# Patient Record
Sex: Female | Born: 1965 | Race: White | Hispanic: No | Smoking: Former smoker
Health system: Southern US, Community
[De-identification: ages and names within clinical notes are randomized; demographics above are authoritative.]

## PROBLEM LIST (undated history)

## (undated) DIAGNOSIS — E039 Hypothyroidism, unspecified: Secondary | ICD-10-CM

## (undated) DIAGNOSIS — F32A Depression, unspecified: Secondary | ICD-10-CM

## (undated) DIAGNOSIS — F329 Major depressive disorder, single episode, unspecified: Secondary | ICD-10-CM

## (undated) DIAGNOSIS — D649 Anemia, unspecified: Secondary | ICD-10-CM

## (undated) DIAGNOSIS — K5792 Diverticulitis of intestine, part unspecified, without perforation or abscess without bleeding: Secondary | ICD-10-CM

## (undated) HISTORY — PX: APPENDECTOMY: SHX54

## (undated) HISTORY — PX: CHOLECYSTECTOMY: SHX55

## (undated) HISTORY — PX: TONSILLECTOMY: SUR1361

## (undated) HISTORY — DX: Major depressive disorder, single episode, unspecified: F32.9

## (undated) HISTORY — PX: TUBAL LIGATION: SHX77

## (undated) HISTORY — DX: Depression, unspecified: F32.A

---

## 1998-04-05 ENCOUNTER — Emergency Department (HOSPITAL_COMMUNITY): Admission: EM | Admit: 1998-04-05 | Discharge: 1998-04-05 | Payer: Self-pay | Admitting: Emergency Medicine

## 2000-03-27 ENCOUNTER — Other Ambulatory Visit: Admission: RE | Admit: 2000-03-27 | Discharge: 2000-03-27 | Payer: Self-pay | Admitting: Gynecology

## 2000-08-07 ENCOUNTER — Encounter: Payer: Self-pay | Admitting: Emergency Medicine

## 2000-08-07 ENCOUNTER — Emergency Department (HOSPITAL_COMMUNITY): Admission: EM | Admit: 2000-08-07 | Discharge: 2000-08-07 | Payer: Self-pay | Admitting: Emergency Medicine

## 2001-04-01 ENCOUNTER — Other Ambulatory Visit: Admission: RE | Admit: 2001-04-01 | Discharge: 2001-04-01 | Payer: Self-pay | Admitting: Gynecology

## 2001-10-08 ENCOUNTER — Other Ambulatory Visit: Admission: RE | Admit: 2001-10-08 | Discharge: 2001-10-08 | Payer: Self-pay | Admitting: Gynecology

## 2002-05-05 ENCOUNTER — Other Ambulatory Visit: Admission: RE | Admit: 2002-05-05 | Discharge: 2002-05-05 | Payer: Self-pay | Admitting: Gynecology

## 2008-08-27 HISTORY — PX: OTHER SURGICAL HISTORY: SHX169

## 2010-06-22 ENCOUNTER — Encounter: Payer: Self-pay | Admitting: Gastroenterology

## 2010-06-27 ENCOUNTER — Encounter (INDEPENDENT_AMBULATORY_CARE_PROVIDER_SITE_OTHER): Payer: Self-pay | Admitting: *Deleted

## 2010-07-13 ENCOUNTER — Ambulatory Visit: Payer: Self-pay | Admitting: Gastroenterology

## 2010-07-13 DIAGNOSIS — N83209 Unspecified ovarian cyst, unspecified side: Secondary | ICD-10-CM

## 2010-07-13 DIAGNOSIS — R634 Abnormal weight loss: Secondary | ICD-10-CM | POA: Insufficient documentation

## 2010-07-13 DIAGNOSIS — R933 Abnormal findings on diagnostic imaging of other parts of digestive tract: Secondary | ICD-10-CM

## 2010-07-17 LAB — CONVERTED CEMR LAB
CRP, High Sensitivity: 5.46 — ABNORMAL HIGH (ref 0.00–5.00)
IgA: 324 mg/dL (ref 68–378)
Sed Rate: 10 mm/hr (ref 0–22)
Tissue Transglutaminase Ab, IgA: 5.4 units (ref <20)

## 2010-08-09 ENCOUNTER — Telehealth: Payer: Self-pay | Admitting: Gastroenterology

## 2010-08-16 ENCOUNTER — Ambulatory Visit: Payer: Self-pay | Admitting: Gastroenterology

## 2010-08-16 LAB — CONVERTED CEMR LAB
Fecal Occult Blood: NEGATIVE
OCCULT 1: NEGATIVE
OCCULT 2: NEGATIVE
OCCULT 3: NEGATIVE
OCCULT 4: NEGATIVE
OCCULT 5: NEGATIVE

## 2010-08-18 ENCOUNTER — Ambulatory Visit: Payer: Self-pay | Admitting: Gastroenterology

## 2010-08-23 ENCOUNTER — Encounter: Payer: Self-pay | Admitting: Gastroenterology

## 2010-08-29 ENCOUNTER — Telehealth (INDEPENDENT_AMBULATORY_CARE_PROVIDER_SITE_OTHER): Payer: Self-pay

## 2010-09-26 NOTE — Assessment & Plan Note (Signed)
Summary: ABD PAIN...AS.   History of Present Illness Visit Type: new patient  Primary GI MD: Elie Goody MD [facility] Primary Provider: Eunice Blase, PA  Requesting Provider: na Chief Complaint: Belching, loss of appetite, nausea and vomiting, and weight loss History of Present Illness:   This is a 45 year old female complaining of a 100 pound weight loss, loss of appetite, early satiety, nausea, vomiting, belching, and intermittent mild epigastric pain that began around March. She states she started walking on a regular basis in the spring but discontinued this about July. She estimates she is only able to take in about 500 calories per day due to her loss of appetite, early satiety, and significant postprandial symptoms if she eats more than just a few bites of food. She states her TSH was performed several months ago and was normal. CBC and chemistry panel were remarkable only for modestly elevated white blood cell count of 11.7. Amylase and lipase were normal. CT scan of the abdomen performed on October 27 at [facility], showed a question of jejunal thickening and a 4.7 cm right ovarian cyst.   GI Review of Systems    Reports belching.   Weight loss of 100 pounds over one year .   Denies abdominal pain, acid reflux, bloating, chest pain, dysphagia with liquids, dysphagia with solids, heartburn, loss of appetite, nausea, vomiting, vomiting blood, weight loss, and  weight gain.        Denies anal fissure, black tarry stools, change in bowel habit, constipation, diarrhea, diverticulosis, fecal incontinence, heme positive stool, hemorrhoids, irritable bowel syndrome, jaundice, light color stool, liver problems, rectal bleeding, and  rectal pain.   Current Medications (verified): 1)  Levothyroxine Sodium 88 Mcg Tabs (Levothyroxine Sodium) .... One Tablet By Mouth Once Daily 2)  Prilosec Otc 20 Mg Tbec (Omeprazole Magnesium) .... One By Mouth Once Daily 3)  Xanax 0.5 Mg Tabs  (Alprazolam) .... One Tablet By Mouth As Needed  Allergies (verified): 1)  ! Cipro 2)  ! Demerol  Past History:  Past Medical History: Hypothyroidism Hyperlipidemia Hypertension Allergic Rhinitis Anxiety Disorder Insomnia Arthritis  Past Surgical History: Reviewed history from 07/13/2010 and no changes required. Appendectomy 1984 Cholecystectomy 1990 Tonsillectomy  Family History: Family History of Diabetes: Father Family History of Heart Disease: Mother No FH of Colon Cancer:  Social History: Chief Strategy Officer Divorced 3 Childern Patient currently smokes Alcohol Use - no Illicit Drug Use - no Daily Caffeine Use: 3 daily   Review of Systems       The patient complains of anxiety-new, fatigue, muscle pains/cramps, and sleeping problems.         The pertinent positives and negatives are noted as above and in the HPI. All other ROS were reviewed and were negative.   Vital Signs:  Patient profile:   45 year old female Height:      66 inches Weight:      177 pounds BMI:     28.67 BSA:     1.90 Pulse rate:   76 / minute Pulse rhythm:   regular BP sitting:   128 / 64  (left arm) Cuff size:   regular  Vitals Entered By: Ok Anis CMA (July 13, 2010 2:42 PM)  Physical Exam  General:  Well developed, well nourished, no acute distress. Head:  Normocephalic and atraumatic. Eyes:  PERRLA, no icterus. Ears:  Normal auditory acuity. Mouth:  No deformity or lesions, dentition normal. Neck:  Supple; no masses or thyromegaly. Lungs:  Clear throughout to auscultation. Heart:  Regular rate and rhythm; no murmurs, rubs,  or bruits. Abdomen:  Soft, nontender and nondistended. No masses, hepatosplenomegaly or hernias noted. Normal bowel sounds. Msk:  Symmetrical with no gross deformities. Normal posture. Pulses:  Normal pulses noted. Extremities:  No clubbing, cyanosis, edema or deformities noted. Neurologic:  Alert and  oriented x4;  grossly normal  neurologically. Cervical Nodes:  No significant cervical adenopathy. Psych:  Alert and cooperative. Normal mood and affect.  Impression & Recommendations:  Problem # 1:  LOSS OF WEIGHT (ICD-783.21) Self reported 100 pound weight loss with early satiety, anorexia, minimal epigastric discomfort, intermittent nausea and vomiting over the past 8 months. Etiology is not clear. Rule out ulcer disease, gastroparesis, median arcuate ligament syndrome, atypical reflux, small intestinal neoplasm, malabsorption syndrome. Resume Prilosec 20 mg q.a.m. The risks, benefits and alternatives to endoscopy with possible biopsy and possible dilation were discussed with the patient and they consent to proceed. The procedure will be scheduled electively. Consider CTA or MRA as next step. Orders: TLB-CRP-High Sensitivity (C-Reactive Protein) (86140-FCRP) TLB-IgA (Immunoglobulin A) (82784-IGA) TLB-Sedimentation Rate (ESR) (85652-ESR) T-Sprue Panel (Celiac Disease Aby Eval) (83516x3/[address]) T-Stool Fats [place] Stain [phone]) [facility] Hemoccult Cards #3 (take home) (Hem cards #3) EGD (EGD)  Problem # 2:  NONSPECIFIC ABN FINDING RAD & OTH EXAM GI TRACT (ICD-793.4) Nonspecific questionable jejunal thickening on CT scan. If above evaluation is unremarkable consider further evaluation with a CT enterography or capsule endoscopy. Orders: EGD (EGD)  Problem # 3:  OVARIAN CYST, RIGHT (ICD-620.2) Followup with her primary physician and gynecologist for the right ovarian cyst.   Patient Instructions: 1)  Please go directly to the basement to have your labs drawn. 2)  Follow instructions on Hemoccult cards and mail back to [place] when finished.  3)  Start Prilosec OTC 20mg  one capsule by mouth once daily. 4)  Upper Endoscopy brochure given.  5)  Copy sent to : Greta O'Buch, PA 6)  The medication list was reviewed and reconciled.  All changed / newly prescribed medications were explained.  A complete medication list  was provided to the patient / caregiver.

## 2010-09-26 NOTE — Letter (Signed)
Summary: EGD Instructions  [facility]  [address] [place], [place] [address]   Phone: [phone]  Fax: [phone]       Carla Herrera    February 27, 1966    MRN: [id number]       Procedure Day /Date: Friday December 23rd, 2011     Arrival Time:  1:30pm     Procedure Time: 2:30pm     Location of Procedure:                    _x  _  Endoscopy Center (4th Floor)    PREPARATION FOR ENDOSCOPY   On 08/18/10 THE DAY OF THE PROCEDURE:  1.   No solid foods, milk or milk products are allowed after midnight the night before your procedure.  2.   Do not drink anything colored red or purple.  Avoid juices with pulp.  No orange juice.  3.  You may drink clear liquids until12:30pm, which is 2 hours before your procedure.                                                                                                CLEAR LIQUIDS INCLUDE: Water Jello Ice Popsicles Tea (sugar ok, no milk/cream) Powdered fruit flavored drinks Coffee (sugar ok, no milk/cream) Gatorade Juice: apple, white grape, white cranberry  Lemonade Clear bullion, consomm, broth Carbonated beverages (any kind) Strained chicken noodle soup Hard Candy   MEDICATION INSTRUCTIONS  Unless otherwise instructed, you should take regular prescription medications with a small sip of water as early as possible the morning of your procedure.        OTHER INSTRUCTIONS  You will need a responsible adult at least 44 years of age to accompany you and drive you home.   This person must remain in the waiting room during your procedure.  Wear loose fitting clothing that is easily removed.  Leave jewelry and other valuables at home.  However, you may wish to bring a book to read or an iPod/MP3 player to listen to music as you wait for your procedure to start.  Remove all body piercing jewelry and leave at home.  Total time from sign-in until discharge is approximately 2-3 hours.  You should go home  directly after your procedure and rest.  You can resume normal activities the day after your procedure.  The day of your procedure you should not:   Drive   Make legal decisions   Operate machinery   Drink alcohol   Return to work  You will receive specific instructions about eating, activities and medications before you leave.    The above instructions have been reviewed and explained to me by   _______________________    I fully understand and can verbalize these instructions _____________________________ Date _________

## 2010-09-26 NOTE — Letter (Signed)
Summary: New Patient letter  [facility]  [address] [place], [place] [address]   Phone: [phone]  Fax: [phone]       06/27/2010 MRN: [id number]  Carla Herrera [address] [place], [place]  [address]  Dear Ms. Spitler,  Welcome to the Gastroenterology Division at [facility].    You are scheduled to see Dr. Russella Dar on 08/08/2010 at 10:00AM on the 3rd floor at [facility], [address]  We ask that you try to arrive at our office 15 minutes prior to your appointment time to allow for check-in.  We would like you to complete the enclosed self-administered evaluation form prior to your visit and bring it with you on the day of your appointment.  We will review it with you.  Also, please bring a complete list of all your medications or, if you prefer, bring the medication bottles and we will list them.  Please bring your insurance card so that we may make a copy of it.  If your insurance requires a referral to see a specialist, please bring your referral form from your primary care physician.  Co-payments are due at the time of your visit and may be paid by cash, check or credit card.     Your office visit will consist of a consult with your physician (includes a physical exam), any laboratory testing he/she may order, scheduling of any necessary diagnostic testing (e.g. x-ray, ultrasound, CT-scan), and scheduling of a procedure (e.g. Endoscopy, Colonoscopy) if required.  Please allow enough time on your schedule to allow for any/all of these possibilities.    If you cannot keep your appointment, please call [phone] to cancel or reschedule prior to your appointment date.  This allows [place] the opportunity to schedule an appointment for another patient in need of care.  If you do not cancel or reschedule by 5 p.m. the business day prior to your appointment date, you will be charged a $50.00 late cancellation/no-show fee.    Thank you for choosing  [facility] for your medical needs.  We appreciate the opportunity to care for you.  Please visit [place] at our website  to learn more about our practice.                     Sincerely,                                                             The Gastroenterology Division

## 2010-09-26 NOTE — Letter (Signed)
Summary: [facility]  [facility]   Imported By: Sherian Rein 07/18/2010 10:35:11  _____________________________________________________________________  External Attachment:    Type:   Image     Comment:   External Document

## 2010-09-28 NOTE — Procedures (Signed)
Summary: Upper Endoscopy  Patient: Carla Herrera Note: All result statuses are Final unless otherwise noted.  Tests: (1) Upper Endoscopy (EGD)   EGD Upper Endoscopy       DONE     [facility]     [address]     [place], [place]  [address]           ENDOSCOPY PROCEDURE REPORT     PATIENT:  Carla, Herrera  MR#:  [id number]     BIRTHDATE:  11-05-1965, 44 yrs. old  GENDER:  female     ENDOSCOPIST:  Judie Petit T. Russella Dar, MD, [facility]           PROCEDURE DATE:  08/18/2010     PROCEDURE:  EGD with biopsy, [address]     ASA CLASS:  Class I     INDICATIONS:  weight loss, epigastric pain, early satiety     MEDICATIONS:  Fentanyl 100 mcg IV, Versed 10 mg IV     TOPICAL ANESTHETIC:  Exactacain Spray     DESCRIPTION OF PROCEDURE:   After the risks benefits and     alternatives of the procedure were thoroughly explained, informed     consent was obtained.  The LB [id number]-[id number] endoscope was     introduced through the mouth and advanced to the second portion of     the duodenum, without limitations.  The instrument was slowly     withdrawn as the mucosa was fully examined.     <<PROCEDUREIMAGES>>     Duodenitis was found in the bulb of the duodenum. It was mild and     erythematous. Multiple biopsies were obtained and sent to     pathology. The duodenal bulb was normal in appearance, as was the     postbulbar duodenum. in the descending duodenum. Random biopsies     were obtained and sent to pathology.  The stomach was entered and     closely examined. The pylorus, antrum, angularis, and lesser     curvature were well visualized, including a retroflexed view of     the cardia and fundus. The stomach wall was normally distensable.     The scope passed easily through the pylorus into the duodenum.     There was mild granular erythema throught the stomach, normal vs     mild gastritis. Biopsies were obtained and sent to pathology.  The     esophagus and gastroesophageal junction were  completely normal in     appearance.  Retroflexed views revealed no abnormalities.  The     scope was then withdrawn from the patient and the procedure     completed.           COMPLICATIONS:  None           ENDOSCOPIC IMPRESSION:     1) Duodenitis in the bulb of duodenum     2) R/O gastritis           RECOMMENDATIONS:     1) Anti-reflux regimen     2) Await pathology results     3) PPI bid; omeprazole 20mg  po bid, #60, 5 refills     4) My office will arrange for you to have a Gastric Emptying     Scan performed.           Venita Lick. Russella Dar, MD, [facility]           n.     eSIGNED:   Venita Lick. Russella Dar at  08/18/2010 02:49 PM           Annia Belt, [id number]  Note: An exclamation mark (!) indicates a result that was not dispersed into the flowsheet. Document Creation Date: 08/18/2010 2:49 PM _______________________________________________________________________  (1) Order result status: Final Collection or observation date-time: 08/18/2010 14:36 Requested date-time:  Receipt date-time:  Reported date-time:  Referring Physician:   Ordering Physician: Claudette Head [phone]) Specimen Source:  Source: Launa Grill Order Number: [id number] Lab site:

## 2010-09-28 NOTE — Progress Notes (Signed)
Summary: Hemoccult cards  Phone Note Outgoing Call Call back at Home Phone [phone]   Call placed by: Christie Nottingham CMA Duncan Dull),  August 09, 2010 2:35 PM Call placed to: Patient Summary of Call: Called pt to ask if she has had a chance to mail back the Hemoccult cards. Left a message on her voicemail. Initial call taken by: Christie Nottingham CMA Duncan Dull),  August 09, 2010 2:36 PM  Follow-up for Phone Call        Pt states her Hemoccult cards have slipped her mind and that she will do them this week and bring them back into our office soon.  Follow-up by: Christie Nottingham CMA Duncan Dull),  August 10, 2010 11:55 AM

## 2010-09-28 NOTE — Letter (Signed)
Summary: Patient [facility] Biopsy Results  [facility]  [address] [place], [place] [address]   Phone: [phone]  Fax: [phone]        August 23, 2010 MRN: [id number]    Carla Herrera [address] [place], [place]  [address]    Dear Ms. Filsaime,  I am pleased to inform you that the biopsies taken during your recent endoscopic examination did not show any evidence of cancer upon pathologic examination. The gastric and duodenal biopsies were normal.   Continue with the treatment plan as outlined on the day of your      exam.  Please call us if you are having persistent problems or have questions about your condition that have not been fully answered at this time.  Sincerely,  Meryl Dare MD [facility]  This letter has been electronically signed by your physician.  Appended Document: Patient Notice-Endo Biopsy Results Letter mailed

## 2010-09-28 NOTE — Miscellaneous (Signed)
Summary: rx, omeprazole  Clinical Lists Changes  Medications: Added new medication of OMEPRAZOLE 20 MG  CPDR (OMEPRAZOLE) 1 twice a day 30 minutes before meals - Signed Rx of OMEPRAZOLE 20 MG  CPDR (OMEPRAZOLE) 1 twice a day 30 minutes before meals;  #60 x 5;  Signed;  Entered by: Weston Brass RN;  Authorized by: Meryl Dare MD [facility];  Method used: Electronically to [facility] [address] [place], [place]  [address], Ph: [phone], Fax: [phone] Observations: Added new observation of ALLERGY REV: Done (08/18/2010 14:51)    Prescriptions: OMEPRAZOLE 20 MG  CPDR (OMEPRAZOLE) 1 twice a day 30 minutes before meals  #60 x 5   Entered by:   Weston Brass RN   Authorized by:   Meryl Dare MD [facility]   Signed by:   Weston Brass RN on 08/18/2010   Method used:   Electronically to        [facility] [phone]* (retail)       [address]       [place], [place]  [address]       Ph: [phone]       Fax: [phone]   RxID:   [id number]

## 2010-09-28 NOTE — Progress Notes (Signed)
Summary: Schedule GES  Phone Note Outgoing Call Call back at [facility] Phone [phone]   Call placed by: Darcey Nora RN, CGRN,  August 29, 2010 10:23 AM Call placed to: Patient Summary of Call: Left message for patient to call back to discuss scheduling GES Initial call taken by: Darcey Nora RN, CGRN,  August 29, 2010 10:23 AM  Follow-up for Phone Call        Left message for patient to call back Darcey Nora RN, [facility]  August 30, 2010 9:35 AM  Patient declines to schedule GES at this time due to finances.  She will call back if she wants to have GES. Follow-up by: Darcey Nora RN, CGRN,  September 04, 2010 11:26 AM  Additional Follow-up for Phone Call Additional follow up Details #1::        Above noted. Additional Follow-up by: Meryl Dare MD [facility],  September 04, 2010 11:31 AM

## 2011-06-28 ENCOUNTER — Other Ambulatory Visit: Payer: Self-pay | Admitting: Gastroenterology

## 2011-06-28 NOTE — Telephone Encounter (Signed)
NEEDS OFFICE VISIT FOR ANY FURTHER REFILLS! 

## 2015-02-11 ENCOUNTER — Inpatient Hospital Stay (HOSPITAL_COMMUNITY)
Admission: AD | Admit: 2015-02-11 | Discharge: 2015-02-22 | DRG: 330 | Disposition: A | Discharge status: Home Health | Payer: Self-pay | Source: Ambulatory Visit | Attending: General Surgery | Admitting: General Surgery

## 2015-02-11 ENCOUNTER — Encounter (HOSPITAL_COMMUNITY): Payer: Self-pay

## 2015-02-11 DIAGNOSIS — K5732 Diverticulitis of large intestine without perforation or abscess without bleeding: Secondary | ICD-10-CM

## 2015-02-11 DIAGNOSIS — K631 Perforation of intestine (nontraumatic): Secondary | ICD-10-CM | POA: Diagnosis present

## 2015-02-11 DIAGNOSIS — N739 Female pelvic inflammatory disease, unspecified: Secondary | ICD-10-CM

## 2015-02-11 DIAGNOSIS — K5900 Constipation, unspecified: Secondary | ICD-10-CM | POA: Diagnosis present

## 2015-02-11 DIAGNOSIS — N7093 Salpingitis and oophoritis, unspecified: Secondary | ICD-10-CM

## 2015-02-11 DIAGNOSIS — K5720 Diverticulitis of large intestine with perforation and abscess without bleeding: Principal | ICD-10-CM | POA: Diagnosis present

## 2015-02-11 DIAGNOSIS — R109 Unspecified abdominal pain: Secondary | ICD-10-CM

## 2015-02-11 DIAGNOSIS — Z9049 Acquired absence of other specified parts of digestive tract: Secondary | ICD-10-CM | POA: Diagnosis present

## 2015-02-11 DIAGNOSIS — N1330 Unspecified hydronephrosis: Secondary | ICD-10-CM | POA: Diagnosis present

## 2015-02-11 DIAGNOSIS — F17210 Nicotine dependence, cigarettes, uncomplicated: Secondary | ICD-10-CM | POA: Diagnosis present

## 2015-02-11 DIAGNOSIS — N730 Acute parametritis and pelvic cellulitis: Secondary | ICD-10-CM | POA: Diagnosis present

## 2015-02-11 LAB — CBC WITH DIFFERENTIAL/PLATELET
Basophils Absolute: 0 10*3/uL (ref 0.0–0.1)
Basophils Relative: 0 % (ref 0–1)
EOS ABS: 0 10*3/uL (ref 0.0–0.7)
EOS PCT: 0 % (ref 0–5)
HEMATOCRIT: 38.5 % (ref 36.0–46.0)
HEMOGLOBIN: 13 g/dL (ref 12.0–15.0)
LYMPHS ABS: 1.7 10*3/uL (ref 0.7–4.0)
Lymphocytes Relative: 10 % — ABNORMAL LOW (ref 12–46)
MCH: 31.6 pg (ref 26.0–34.0)
MCHC: 33.8 g/dL (ref 30.0–36.0)
MCV: 93.4 fL (ref 78.0–100.0)
MONO ABS: 1.3 10*3/uL — AB (ref 0.1–1.0)
MONOS PCT: 8 % (ref 3–12)
Neutro Abs: 14.5 10*3/uL — ABNORMAL HIGH (ref 1.7–7.7)
Neutrophils Relative %: 82 % — ABNORMAL HIGH (ref 43–77)
Platelets: 230 10*3/uL (ref 150–400)
RBC: 4.12 MIL/uL (ref 3.87–5.11)
RDW: 13.8 % (ref 11.5–15.5)
WBC: 17.5 10*3/uL — ABNORMAL HIGH (ref 4.0–10.5)

## 2015-02-11 MED ORDER — SODIUM CHLORIDE 0.9 % IV SOLN
2.0000 g | Freq: Four times a day (QID) | INTRAVENOUS | Status: DC
  Filled 2015-02-11 (×3): qty 2000

## 2015-02-11 MED ORDER — CITALOPRAM HYDROBROMIDE 40 MG PO TABS
40.0000 mg | ORAL_TABLET | Freq: Every day | ORAL | Status: DC
  Administered 2015-02-12 – 2015-02-16 (×5): 40 mg via ORAL
  Filled 2015-02-11 (×8): qty 1

## 2015-02-11 MED ORDER — CLINDAMYCIN PHOSPHATE 900 MG/50ML IV SOLN: 900.0000 mg | Freq: Three times a day (TID) | INTRAVENOUS | Status: DC

## 2015-02-11 MED ORDER — DOXYCYCLINE HYCLATE 100 MG IV SOLR
100.0000 mg | Freq: Two times a day (BID) | INTRAVENOUS | Status: DC
  Administered 2015-02-11 – 2015-02-12 (×2): 100 mg via INTRAVENOUS
  Filled 2015-02-11 (×3): qty 100

## 2015-02-11 MED ORDER — SODIUM CHLORIDE 0.9 % IV SOLN: 250.0000 mL | INTRAVENOUS | Status: DC | PRN

## 2015-02-11 MED ORDER — SODIUM CHLORIDE 0.9 % IJ SOLN: 3.0000 mL | INTRAMUSCULAR | Status: DC | PRN

## 2015-02-11 MED ORDER — MORPHINE SULFATE 4 MG/ML IJ SOLN
2.0000 mg | INTRAMUSCULAR | Status: DC | PRN
  Administered 2015-02-11 – 2015-02-12 (×10): 2 mg via INTRAVENOUS
  Filled 2015-02-11 (×10): qty 1

## 2015-02-11 MED ORDER — DEXTROSE 5 % IV SOLN
2.0000 g | Freq: Two times a day (BID) | INTRAVENOUS | Status: DC
  Administered 2015-02-11 – 2015-02-12 (×3): 2 g via INTRAVENOUS
  Filled 2015-02-11 (×4): qty 2

## 2015-02-11 MED ORDER — SODIUM CHLORIDE 0.9 % IJ SOLN: 3.0000 mL | Freq: Two times a day (BID) | INTRAMUSCULAR | Status: DC

## 2015-02-11 MED ORDER — LACTATED RINGERS IV SOLN
INTRAVENOUS | Status: DC
  Administered 2015-02-11 – 2015-02-12 (×3): via INTRAVENOUS

## 2015-02-11 NOTE — MAU Provider Note (Signed)
  History     CSN: [id number]  Arrival date and time: 02/11/15 1158   None     Chief Complaint  Patient presents with  . Abdominal Pain  . Fever   HPI Comments: Non-pregnant female sent from office d/t increased abdominal pain. Was being treated outpt for PID with Doxycycline and Flagyl-started yesterday. Sono 2 days ago-possible Rt pyosalpinx. No fever or chills. +nause, no vomiting. LMP 02/07/15. Hx of appendectomy and BTL.    No past medical history on file.  No past surgical history on file.  No family history on file.  History  Substance Use Topics  . Smoking status: Not on file  . Smokeless tobacco: Not on file  . Alcohol Use: Not on file    Allergies:  Allergies  Allergen Reactions  . Ciprofloxacin     REACTION: Rash  . Meperidine Hcl     REACTION: convulsions    Prescriptions prior to admission  Medication Sig Dispense Refill Last Dose  . citalopram (CELEXA) 40 MG tablet Take 40 mg by mouth daily.   Past Week at Unknown time  . doxycycline (DORYX) 100 MG EC tablet Take 100 mg by mouth 2 (two) times daily.   02/10/2015 at Unknown time  . metroNIDAZOLE (FLAGYL) 500 MG tablet Take 500 mg by mouth 2 (two) times daily.   02/10/2015 at Unknown time  . [DISCONTINUED] omeprazole (PRILOSEC) 20 MG capsule TAKE ONE CAPSULE BY MOUTH TWICE DAILY 30 MINUTES BEFORE MEALS (Patient not taking: Reported on 02/11/2015) 60 capsule 0 Not Taking at Unknown time    Review of Systems  Constitutional: Negative.   HENT: Negative.   Eyes: Negative.   Respiratory: Negative.   Cardiovascular: Negative.   Gastrointestinal: Positive for nausea and abdominal pain.  Genitourinary: Negative.   Musculoskeletal: Positive for back pain.  Skin: Negative.   Neurological: Negative.   Endo/Heme/Allergies: Negative.   Psychiatric/Behavioral: Negative.    Physical Exam   Blood pressure 138/55, pulse 91, temperature 98.5 F (36.9 C), temperature source Axillary, resp. rate 20, last menstrual  period 02/07/2015, SpO2 99 %.  Physical Exam  Constitutional: She is oriented to person, place, and time. She appears well-developed and well-nourished.  HENT:  Head: Normocephalic and atraumatic.  Neck: Normal range of motion. Neck supple.  Cardiovascular: Normal rate and regular rhythm.   Respiratory: Effort normal and breath sounds normal.  GI: Soft. Bowel sounds are normal. There is no tenderness. There is no guarding.  Genitourinary:  deferred  Musculoskeletal: Normal range of motion.  Neurological: She is alert and oriented to person, place, and time.  Skin: Skin is warm and dry.  Psychiatric: She has a normal mood and affect.    MAU Course  Procedures  CBC  Assessment and Plan  Acute PID Admit for IV abx and pain mngt Dr. Seymour Bars notified of A/P-Dr. Audry Pili call MD to follow  Donette Larry, N 02/11/2015, 2:09 PM

## 2015-02-11 NOTE — MAU Note (Signed)
Pain started on Monday, treated- Azithromax and Rocephin, started to get better than got bad Tues night- was given Flagyl and Doxy. Highest temp 102.6 last night

## 2015-02-11 NOTE — H&P (Signed)
Carla Herrera is an 49 y.o. female. admitted for inpt therapy for acute PID  Patient's last menstrual period was 02/07/2015 (exact date).    Medical Hx: unremarkable  Surgical Hx: cholecystectomy, appendectomy, BTL, tonsillectomy  No family history on file.  Social History:  has no tobacco, alcohol, and drug history on file. Divorced.  Allergies:  Allergies  Allergen Reactions  . Ciprofloxacin     REACTION: Rash  . Meperidine Hcl     REACTION: convulsions    Prescriptions prior to admission  Medication Sig Dispense Refill Last Dose  . citalopram (CELEXA) 40 MG tablet Take 40 mg by mouth daily.   Past Week at Unknown time  . doxycycline (DORYX) 100 MG EC tablet Take 100 mg by mouth 2 (two) times daily.   02/10/2015 at Unknown time  . metroNIDAZOLE (FLAGYL) 500 MG tablet Take 500 mg by mouth 2 (two) times daily.   02/10/2015 at Unknown time  . [DISCONTINUED] omeprazole (PRILOSEC) 20 MG capsule TAKE ONE CAPSULE BY MOUTH TWICE DAILY 30 MINUTES BEFORE MEALS (Patient not taking: Reported on 02/11/2015) 60 capsule 0 Not Taking at Unknown time    Review of Systems  Constitutional: Negative.   HENT: Negative.   Eyes: Negative.   Respiratory: Negative.   Cardiovascular: Negative.   Gastrointestinal: Positive for nausea and abdominal pain.  Genitourinary: Negative.   Musculoskeletal: Positive for back pain.  Skin: Negative.   Neurological: Negative.   Endo/Heme/Allergies: Negative.   Psychiatric/Behavioral: Negative.     Blood pressure 138/55, pulse 91, temperature 98.5 F (36.9 C), temperature source Axillary, resp. rate 20, last menstrual period 02/07/2015, SpO2 99 %. Physical Exam  Constitutional: She is oriented to person, place, and time. She appears well-developed and well-nourished.  HENT:  Head: Normocephalic and atraumatic.  Neck: Normal range of motion. Neck supple.  Cardiovascular: Normal rate and regular rhythm.   Respiratory: Effort normal and breath sounds  normal.  GI: Soft. Bowel sounds are normal.  Genitourinary:  deferred  Musculoskeletal: Normal range of motion.  Neurological: She is alert and oriented to person, place, and time.  Skin: Skin is warm and dry.  Psychiatric: She has a normal mood and affect.    Results for orders placed or performed during the hospital encounter of 02/11/15 (from the past 24 hour(s))  CBC with Differential     Status: Abnormal   Collection Time: 02/11/15  1:05 PM  Result Value Ref Range   WBC 17.5 (H) 4.0 - 10.5 K/uL   RBC 4.12 3.87 - 5.11 MIL/uL   Hemoglobin 13.0 12.0 - 15.0 g/dL   HCT 92.4 26.8 - 34.1 %   MCV 93.4 78.0 - 100.0 fL   MCH 31.6 26.0 - 34.0 pg   MCHC 33.8 30.0 - 36.0 g/dL   RDW 96.2 22.9 - 79.8 %   Platelets 230 150 - 400 K/uL   Neutrophils Relative % 82 (H) 43 - 77 %   Neutro Abs 14.5 (H) 1.7 - 7.7 K/uL   Lymphocytes Relative 10 (L) 12 - 46 %   Lymphs Abs 1.7 0.7 - 4.0 K/uL   Monocytes Relative 8 3 - 12 %   Monocytes Absolute 1.3 (H) 0.1 - 1.0 K/uL   Eosinophils Relative 0 0 - 5 %   Eosinophils Absolute 0.0 0.0 - 0.7 K/uL   Basophils Relative 0 0 - 1 %   Basophils Absolute 0.0 0.0 - 0.1 K/uL    No results found.  Assessment/Plan: Acute PID Admit, NPO, Cefotetan, Doxycycline, Morphine  Dr. Seymour Bars notified of A/P-Dr. Cherly Hensen on call MD to follow  Donette Larry, N 02/11/2015, 2:05 PM

## 2015-02-12 ENCOUNTER — Encounter (HOSPITAL_COMMUNITY): Payer: Self-pay

## 2015-02-12 ENCOUNTER — Inpatient Hospital Stay (HOSPITAL_COMMUNITY): Payer: Self-pay

## 2015-02-12 DIAGNOSIS — K5732 Diverticulitis of large intestine without perforation or abscess without bleeding: Secondary | ICD-10-CM

## 2015-02-12 DIAGNOSIS — N730 Acute parametritis and pelvic cellulitis: Secondary | ICD-10-CM

## 2015-02-12 DIAGNOSIS — K631 Perforation of intestine (nontraumatic): Secondary | ICD-10-CM | POA: Diagnosis present

## 2015-02-12 LAB — CBC
HCT: 36.1 % (ref 36.0–46.0)
Hemoglobin: 11.7 g/dL — ABNORMAL LOW (ref 12.0–15.0)
MCH: 30.3 pg (ref 26.0–34.0)
MCHC: 32.4 g/dL (ref 30.0–36.0)
MCV: 93.5 fL (ref 78.0–100.0)
Platelets: 201 10*3/uL (ref 150–400)
RBC: 3.86 MIL/uL — ABNORMAL LOW (ref 3.87–5.11)
RDW: 13.4 % (ref 11.5–15.5)
WBC: 12.7 10*3/uL — ABNORMAL HIGH (ref 4.0–10.5)

## 2015-02-12 LAB — CBC WITH DIFFERENTIAL/PLATELET
BASOS ABS: 0 10*3/uL (ref 0.0–0.1)
Basophils Relative: 0 % (ref 0–1)
Eosinophils Absolute: 0.1 10*3/uL (ref 0.0–0.7)
Eosinophils Relative: 1 % (ref 0–5)
HCT: 36.4 % (ref 36.0–46.0)
HEMOGLOBIN: 12.1 g/dL (ref 12.0–15.0)
LYMPHS PCT: 18 % (ref 12–46)
Lymphs Abs: 2.2 10*3/uL (ref 0.7–4.0)
MCH: 31.3 pg (ref 26.0–34.0)
MCHC: 33.2 g/dL (ref 30.0–36.0)
MCV: 94.1 fL (ref 78.0–100.0)
MONO ABS: 0.9 10*3/uL (ref 0.1–1.0)
MONOS PCT: 7 % (ref 3–12)
NEUTROS ABS: 9 10*3/uL — AB (ref 1.7–7.7)
Neutrophils Relative %: 74 % (ref 43–77)
Platelets: 192 10*3/uL (ref 150–400)
RBC: 3.87 MIL/uL (ref 3.87–5.11)
RDW: 13.8 % (ref 11.5–15.5)
WBC: 12.1 10*3/uL — AB (ref 4.0–10.5)

## 2015-02-12 LAB — URINALYSIS, ROUTINE W REFLEX MICROSCOPIC
Bilirubin Urine: NEGATIVE
GLUCOSE, UA: NEGATIVE mg/dL
KETONES UR: 15 mg/dL — AB
Leukocytes, UA: NEGATIVE
Nitrite: NEGATIVE
PROTEIN: NEGATIVE mg/dL
Specific Gravity, Urine: 1.015 (ref 1.005–1.030)
UROBILINOGEN UA: 0.2 mg/dL (ref 0.0–1.0)
pH: 6 (ref 5.0–8.0)

## 2015-02-12 LAB — URINE MICROSCOPIC-ADD ON

## 2015-02-12 LAB — CREATININE, SERUM
Creatinine, Ser: 0.93 mg/dL (ref 0.44–1.00)
GFR calc non Af Amer: >60 mL/min (ref >60)

## 2015-02-12 MED ORDER — METRONIDAZOLE IN NACL 5-0.79 MG/ML-% IV SOLN
500.0000 mg | Freq: Three times a day (TID) | INTRAVENOUS | Status: DC
  Administered 2015-02-12 – 2015-02-14 (×6): 500 mg via INTRAVENOUS
  Filled 2015-02-12 (×9): qty 100

## 2015-02-12 MED ORDER — NALOXONE HCL 0.4 MG/ML IJ SOLN: 0.4000 mg | INTRAMUSCULAR | Status: DC | PRN

## 2015-02-12 MED ORDER — IOHEXOL 300 MG/ML  SOLN
100.0000 mL | Freq: Once | INTRAMUSCULAR | Status: AC | PRN
  Administered 2015-02-12: 100 mL via INTRAVENOUS

## 2015-02-12 MED ORDER — SODIUM CHLORIDE 0.9 % IV BOLUS (SEPSIS): 1000.0000 mL | Freq: Once | INTRAVENOUS | Status: DC

## 2015-02-12 MED ORDER — PIPERACILLIN-TAZOBACTAM 3.375 G IVPB
3.3750 g | Freq: Three times a day (TID) | INTRAVENOUS | Status: DC
  Administered 2015-02-12 – 2015-02-14 (×6): 3.375 g via INTRAVENOUS
  Filled 2015-02-12 (×8): qty 50

## 2015-02-12 MED ORDER — SODIUM CHLORIDE 0.9 % IJ SOLN: 9.0000 mL | INTRAMUSCULAR | Status: DC | PRN

## 2015-02-12 MED ORDER — DIPHENHYDRAMINE HCL 12.5 MG/5ML PO ELIX: 12.5000 mg | ORAL_SOLUTION | Freq: Four times a day (QID) | ORAL | Status: DC | PRN

## 2015-02-12 MED ORDER — HYDROMORPHONE 0.3 MG/ML IV SOLN
INTRAVENOUS | Status: DC
  Administered 2015-02-12: 0.2 mg via INTRAVENOUS
  Administered 2015-02-13: 0 mg via INTRAVENOUS
  Administered 2015-02-13: 10.6 mg via INTRAVENOUS
  Administered 2015-02-13: 1.59 mg via INTRAVENOUS
  Administered 2015-02-13: 4.39 mg via INTRAVENOUS
  Administered 2015-02-13: 1.39 mg via INTRAVENOUS
  Administered 2015-02-14: 1.6 mg via INTRAVENOUS
  Administered 2015-02-14: 0.4 mg via INTRAVENOUS
  Administered 2015-02-14: 0.2 mg via INTRAVENOUS
  Filled 2015-02-12 (×2): qty 25

## 2015-02-12 MED ORDER — HEPARIN SODIUM (PORCINE) 5000 UNIT/ML IJ SOLN
5000.0000 [IU] | Freq: Three times a day (TID) | INTRAMUSCULAR | Status: AC
  Administered 2015-02-12 – 2015-02-16 (×13): 5000 [IU] via SUBCUTANEOUS
  Filled 2015-02-12 (×14): qty 1

## 2015-02-12 MED ORDER — DIPHENHYDRAMINE HCL 50 MG/ML IJ SOLN: 12.5000 mg | Freq: Four times a day (QID) | INTRAMUSCULAR | Status: DC | PRN

## 2015-02-12 MED ORDER — ONDANSETRON HCL 4 MG/2ML IJ SOLN
4.0000 mg | Freq: Four times a day (QID) | INTRAMUSCULAR | Status: DC | PRN
  Administered 2015-02-13 – 2015-02-14 (×2): 4 mg via INTRAVENOUS
  Filled 2015-02-12 (×2): qty 2

## 2015-02-12 MED ORDER — IOHEXOL 300 MG/ML  SOLN
50.0000 mL | Freq: Once | INTRAMUSCULAR | Status: AC | PRN
  Administered 2015-02-12: 50 mL via ORAL

## 2015-02-12 MED ORDER — KCL IN DEXTROSE-NACL 20-5-0.45 MEQ/L-%-% IV SOLN
INTRAVENOUS | Status: DC
  Administered 2015-02-12 – 2015-02-14 (×3): via INTRAVENOUS
  Administered 2015-02-14: 150 mL/h via INTRAVENOUS
  Administered 2015-02-15: 22:00:00 via INTRAVENOUS
  Administered 2015-02-15 – 2015-02-16 (×2): 150 mL/h via INTRAVENOUS
  Administered 2015-02-16 (×2): via INTRAVENOUS
  Administered 2015-02-17: 150 mL/h via INTRAVENOUS
  Filled 2015-02-12 (×19): qty 1000

## 2015-02-12 MED ORDER — MORPHINE SULFATE 2 MG/ML IJ SOLN
1.0000 mg | INTRAMUSCULAR | Status: DC | PRN
  Administered 2015-02-12: 2 mg via INTRAVENOUS
  Filled 2015-02-12: qty 1

## 2015-02-12 MED ORDER — MORPHINE SULFATE 2 MG/ML IJ SOLN
2.0000 mg | Freq: Once | INTRAMUSCULAR | Status: AC
  Administered 2015-02-12: 2 mg via INTRAVENOUS
  Filled 2015-02-12: qty 1

## 2015-02-12 MED ORDER — GUAIFENESIN-DM 100-10 MG/5ML PO SYRP: 5.0000 mL | ORAL_SOLUTION | ORAL | Status: DC | PRN

## 2015-02-12 NOTE — Plan of Care (Signed)
Received a page from Dr. cousins OB physician at [facility] requesting to transfer this patient under my care for perforated bowel. She is a 49 year old female with known medical problem of reflux.  She was admitted to [facility] as initially there was a thought that she might have PID however further imaging showed diverticulitis with bowel perforation. Dr. cousins had initially called Dr. Ezzard Standing general surgeon on-call who requested hospitalist team to admit.   Since patient has no active medical issues besides diverticulitis with bowel perforation I discussed her case with Dr. Ezzard Standing, due to lack of active medical issues Dr. Ezzard Standing will be admitting this patient. If there are any medical problems he would consult hospitalist team and we would be glad to assist. He does request me to place transfer orders as he is going to the OR urgently. I have placed transfer orders and discussed the plan with Dr. cousins as well.   Note.  1. Diverticulitis with bowel perforation. Nothing by mouth, IV fluids, IV Zosyn dosed by pharmacy.  2. Recent diagnosis of bacterial vaginosis. 7 days of Flagyl IV.   Kindly inform Dr. Ezzard Standing general surgeon when patient arrives.

## 2015-02-12 NOTE — Progress Notes (Signed)
Pt picked up by [facility]. [facility]red to [facility] to be [facility].

## 2015-02-12 NOTE — Progress Notes (Signed)
Reviewed Abd/CT scan  Results; 4.29mm lung nodule RML ( smoker) pt advised.  Sigmoid diverticulitis with perforation, surrounding  Mild left hydroureteronephrosis thought 2nd to inflammation, nl uterus, nl ovaries  findings disc with pt and general surgeon( dr Ezzard Standing) on call phys consulted. To transfer pt to [facility] on the internal med service with gen surgeon to follow. To treat with antibiotic and serial exam.  Spoke with IM ( hospitalist)  for admission to [facility]. Advised pt will need to complete Flagyl treatment for BV x 7 days. Gyn is signing off on care

## 2015-02-12 NOTE — Progress Notes (Signed)
Pt arrived via [facility] and was assisted into bed.  Pt c/o "a lot of pain."  Transporter reported pt had had pain meds around 1702 before departure.  Contacted Dr. Thedore Mins and Dr. Ezzard Standing to advise them of pt's arrival.  Received orders for one time dose of additional morphine and then new orders for PRN morphine with increased frequency.  Pt tear-eyed, holding stomach when medicine given.  Advised her of Dr. Allene Pyo med orders and his plan to assess her this evening.  Gave night nurse report.

## 2015-02-12 NOTE — Progress Notes (Signed)
ANTIBIOTIC CONSULT NOTE - INITIAL  Pharmacy Consult for Zosyn Indication: Diverticulitis with bowel perforation  Allergies  Allergen Reactions  . Ciprofloxacin     REACTION: Rash  . Meperidine Hcl     REACTION: convulsions    Patient Measurements: Height: 5\' 6"  (167.6 cm) Weight: 177 lb (80.287 kg) IBW/kg (Calculated) : 59.3 kg   Vital Signs: Temp: 98.9 F (37.2 C) (06/18 1356) Temp Source: Oral (06/18 1356) BP: 106/48 mmHg (06/18 1356) Pulse Rate: 82 (06/18 1356) Intake/Output from previous day: 06/17 0701 - 06/18 0700 In: 1858.3 [I.V.:1508.3; IV Piggyback:350] Out: 1000 [Urine:1000] Intake/Output from this shift: Total I/O In: 770.8 [I.V.:770.8] Out: 950 [Urine:950]  Labs:  Recent Labs  02/11/15 1305 02/12/15 1334  WBC 17.5* 12.1*  HGB 13.0 12.1  PLT 230 192   CrCl cannot be calculated (Patient has no serum creatinine result on file.).    Microbiology: No results found for this or any previous visit (from the past 720 hour(s)).  Medical History: History reviewed. No pertinent past medical history.  Medications:  Flagyl 500 mg IV Q 8 hr started. Cefotetan and doxycycline discontinued.  Assessment: 49 yo female with diverticulitis and bowel perforation.  Plan:  Zosyn 3.375 grams IV Q 8 hr  Natasha Bence 02/12/2015,4:07 PM

## 2015-02-12 NOTE — Plan of Care (Signed)
Problem: Phase I Progression Outcomes Goal: Initial discharge plan identified Outcome: Completed/Met Date Met:  02/12/15 Afebrile Pain controlled Infection improving    Goal: Voiding-avoid urinary catheter unless indicated Outcome: Completed/Met Date Met:  02/12/15 Voids qs urine without difficulty.

## 2015-02-12 NOTE — Progress Notes (Signed)
S: still in pain Some back pain Has been NPO. Hx reviewed Last coitus 2 wk ago( same partner for 6 yrs lmp 6/13 hx TL,appy On Cefotetan/Doxycycline/Flagyl O:  Filed Vitals:   02/11/15 2117 02/12/15 0109 02/12/15 0525 02/12/15 1023  BP: 112/56 105/55 90/43 105/55  Pulse: 97 86 84 81  Temp: 101.1 F (38.4 C) 100.5 F (38.1 C) 100.3 F (37.9 C) 98.9 F (37.2 C)  TempSrc: Oral Oral Oral Oral  Resp: 18 15 16 18   Height:      Weight:      SpO2: 96% 97% 96% 94%  Cor RRR Lungs clear to A Abd: soft nondistended active BS throughout Diffusely tender (+)\guarding Pelvic; deferred extr no edema  LABS: (-)G/C, ucx neg  Dx w/ BV  IMP: BV Abdominal pain reportedly admitted as acute PID   However pt has had TL, cultures neg and BV positive On IV  Antibiotics still with temp spike R/o abscess , diverticular disease P) PCA dilaudid. Abdominopelvic CT scan. Cont IV antibiotics. Check cbc

## 2015-02-12 NOTE — Consult Note (Addendum)
Re:   Carla Herrera DOB:   08/02/1966 MRN:   [id number]  ASSESSMENT AND PLAN: 1.  Focally perforated diverticulitis  On antibiotics, NPO, and IVF.  Repeat labs in AM.  On Zosyn and Flagyl  2.  Bacterial vaginosis.  Plan Flagyl 7 days for this. 3.  Smokes 4.  History of elevated WBC where she saw a hematologist, but this was benign.  She does not remember whom she saw. 5.  4.5 mm right middle lobe nodule -this will need follow up  I gave patient and boyfriend a copy of the CT scan  Chief Complaint  Patient presents with  . Abdominal Pain  . Fever   REFERRING PHYSICIAN: No primary care provider on file.  HISTORY OF PRESENT ILLNESS: Carla Herrera is a 49 y.o. (DOB: July 16, 1966)  white  female whose primary care physician is No primary care provider on file. (Dr. Vance Gather, [facility]).    She is seeing Dr. Ronnald Ramp at [facility].   Her symptoms began with constipation on Monday, 02/07/2015.  She took some mag citrate. By Wednesday, she was having abdominal pain.  She had a colonoscopy over 10 years ago for possible C. Diff, but it was neg.  Her colonoscopy was done in [place], but she can not remember who did it.  Because of worsening pain, she was admitted to [facility] hospital on 02/10/2015 with possible PID.   I was called by Dr. Nelta Numbers about [name] who was admitted to [facility] hospital for possible PID.  Dr. Cherly Hensen obtained a CT scan today which showed 4.5 mm nodule seen in right middle lobe. If the patient is at high risk for bronchogenic carcinoma, follow-up chest CT at 1 year is recommended. If the patient is at low risk, no follow-up is needed.     Sigmoid diverticulitis is noted with soft tissue gas noted around the involved colon in the pelvis consistent with perforation. Mild left hydroureteronephrosis is noted most likely due to the surrounding inflammation.    Because it looks like she has a primary colon probem, the patient has been transferred to  [facility].  I discussed the case with medicine, and the fact that she has basically no medical issues, she is admitted to the surgery service.   She has had a prior open cholecystectomy, an appendectomy and a TL.  She has no other GI issues.   History reviewed. No pertinent past medical history.   History reviewed. No pertinent past surgical history.    Current Facility-Administered Medications  Medication Dose Route Frequency Provider Last Rate Last Dose  . citalopram (CELEXA) tablet 40 mg  40 mg Oral Daily Lawernce Pitts, CNM   40 mg at 02/12/15 4401  . diphenhydrAMINE (BENADRYL) injection 12.5 mg  12.5 mg Intravenous Q6H PRN Maxie Better, MD       Or  . diphenhydrAMINE (BENADRYL) 12.5 MG/5ML elixir 12.5 mg  12.5 mg Oral Q6H PRN Maxie Better, MD      . guaiFENesin-dextromethorphan (ROBITUSSIN DM) 100-10 MG/5ML syrup 5 mL  5 mL Oral Q4H PRN Leroy Sea, MD      . heparin injection 5,000 Units  5,000 Units Subcutaneous 3 times per day Leroy Sea, MD      . HYDROmorphone (DILAUDID) PCA injection 0.3 mg/mL   Intravenous 6 times per day Maxie Better, MD      . lactated ringers infusion   Intravenous Continuous Leroy Sea, MD 100 mL/hr at 02/12/15 1705    .  metroNIDAZOLE (FLAGYL) IVPB 500 mg  500 mg Intravenous Q8H Leroy Sea, MD   500 mg at 02/12/15 1637  . morphine 4 MG/ML injection 2 mg  2 mg Intravenous Q2H PRN Lawernce Pitts, CNM   2 mg at 02/12/15 1702  . naloxone (NARCAN) injection 0.4 mg  0.4 mg Intravenous PRN Maxie Better, MD       And  . sodium chloride 0.9 % injection 9 mL  9 mL Intravenous PRN Maxie Better, MD      . ondansetron (ZOFRAN) injection 4 mg  4 mg Intravenous Q6H PRN Maxie Better, MD      . piperacillin-tazobactam (ZOSYN) IVPB 3.375 g  3.375 g Intravenous Q8H Genia Del, MD   3.375 g at 02/12/15 1636      Allergies  Allergen Reactions  . Ciprofloxacin     REACTION: Rash  . Meperidine Hcl      REACTION: convulsions    REVIEW OF SYSTEMS: Skin:  No history of rash.  No history of abnormal moles. Infection:  No history of hepatitis or HIV.  No history of MRSA. Neurologic:  No history of stroke.  No history of seizure.  No history of headaches. Cardiac:  No history of hypertension. No history of heart disease.  No history of prior cardiac catheterization.  No history of seeing a cardiologist. Pulmonary:  Smokes  Endocrine:  No diabetes. No thyroid disease. Gastrointestinal:  Had appendectomy about 1983.  She had an open cholecystectomy in the early 1990's.  She has had a TL. Urologic:  No history of kidney stones.  No history of bladder infections. Musculoskeletal:  No history of joint or back disease. Hematologic:  She was seen by a hematologist at [facility] for elevated WBC about 2011.  This was determined to be benign.Marland Kitchen Psycho-social:  The patient is oriented.   The patient has no obvious psychologic or social impairment to understanding our conversation and plan.  SOCIAL and FAMILY HISTORY: Single. Boyfriend, Elmo Putt, at bedside. Works as Land for [employer] Has 4 children: 14 yo daughter, 29 yo son, 43 yo son, and 70 yo son  PHYSICAL EXAM: BP 126/57 mmHg  Pulse 89  Temp(Src) 99.2 F (37.3 C) (Oral)  Resp 18  Ht 5\' 6"  (1.676 m)  Wt 80.287 kg (177 lb)  BMI 28.58 kg/m2  SpO2 97%  LMP 02/07/2015 (Exact Date)  General: Obese WN WF who is alert.  She is uncomfortable.  HEENT: Normal. Pupils equal. Neck: Supple. No mass.  No thyroid mass. Lymph Nodes:  No supraclavicular or cervical nodes. Lungs: Clear to auscultation and symmetric breath sounds. Heart:  RRR. No murmur or rub. Abdomen: Has BS.  She is tender in LLQ with mild rebound.  She has a large right subcostal scar. Rectal: Not done. Extremities:  Good strength and ROM  in upper and lower extremities. Neurologic:  Grossly intact to motor and sensory function. Psychiatric: Has  normal mood and affect. Behavior is normal.   DATA REVIEWED: Epic notes.  [name], MD,  [facility] [address]  [address]   [place], [place]    [address] Phone:  [phone] FAX:  [phone]

## 2015-02-13 LAB — CBC
HCT: 35.5 % — ABNORMAL LOW (ref 36.0–46.0)
HEMOGLOBIN: 11.3 g/dL — AB (ref 12.0–15.0)
MCH: 30.8 pg (ref 26.0–34.0)
MCHC: 31.8 g/dL (ref 30.0–36.0)
MCV: 96.7 fL (ref 78.0–100.0)
PLATELETS: 192 10*3/uL (ref 150–400)
RBC: 3.67 MIL/uL — ABNORMAL LOW (ref 3.87–5.11)
RDW: 13.6 % (ref 11.5–15.5)
WBC: 10.2 10*3/uL (ref 4.0–10.5)

## 2015-02-13 LAB — BASIC METABOLIC PANEL
Anion gap: 10 (ref 5–15)
BUN: 6 mg/dL (ref 6–20)
CO2: 26 mmol/L (ref 22–32)
Calcium: 8.3 mg/dL — ABNORMAL LOW (ref 8.9–10.3)
Chloride: 102 mmol/L (ref 101–111)
Creatinine, Ser: 0.86 mg/dL (ref 0.44–1.00)
GFR calc Af Amer: >60 mL/min (ref >60)
GFR calc non Af Amer: >60 mL/min (ref >60)
Glucose, Bld: 87 mg/dL (ref 65–99)
Potassium: 3.9 mmol/L (ref 3.5–5.1)
SODIUM: 138 mmol/L (ref 135–145)

## 2015-02-13 MED ORDER — ACETAMINOPHEN 325 MG PO TABS
650.0000 mg | ORAL_TABLET | ORAL | Status: DC | PRN
  Administered 2015-02-13 – 2015-02-14 (×2): 650 mg via ORAL
  Filled 2015-02-13 (×2): qty 2

## 2015-02-13 NOTE — Plan of Care (Signed)
Problem: Phase II Progression Outcomes Goal: Progress activity as tolerated unless otherwise ordered Outcome: Not Met (add Reason) Pt attempted short walk in hall, but vomited.  No other reports of N/V today. Goal: Vital signs remain stable Outcome: Progressing Mildly bradycardic (upper 50's) at times, but afebrile.

## 2015-02-13 NOTE — Progress Notes (Signed)
Patient ID: Carla Herrera, female   DOB: 1966-07-05, 49 y.o.   MRN: [id number]  General Surgery [facility]  Subjective: Patient in bed, less pain than last night.  No nausea.  Loose BM early this AM.  Objective: Vital signs in last 24 hours: Temp:  [98 F (36.7 C)-99.2 F (37.3 C)] 98 F (36.7 C) (06/19 0528) Pulse Rate:  [68-89] 72 (06/19 0528) Resp:  [11-18] 11 (06/19 0600) BP: (99-126)/(47-59) 111/47 mmHg (06/19 0528) SpO2:  [93 %-100 %] 95 % (06/19 0600) Weight:  [99.701 kg (219 lb 12.8 oz)] 99.701 kg (219 lb 12.8 oz) (06/19 0518) Last BM Date: 02/10/15  Intake/Output from previous day: 06/18 0701 - 06/19 0700 In: 1260.4 [I.V.:1260.4] Out: 1700 [Urine:1700] Intake/Output this shift:    Physical Exam: HEENT - sclerae clear, mucous membranes moist Neck - soft Chest - clear bilaterally Cor - RRR Abdomen - soft without distension; bilat lower quad tenderness with guarding; BS present; no mass Ext - no edema, non-tender Neuro - alert & oriented, no focal deficits  Lab Results:   Recent Labs  02/12/15 1907 02/13/15 0550  WBC 12.7* 10.2  HGB 11.7* 11.3*  HCT 36.1 35.5*  PLT 201 192   BMET  Recent Labs  02/12/15 1907 02/13/15 0550  NA  --  138  K  --  3.9  CL  --  102  CO2  --  26  GLUCOSE  --  87  BUN  --  6  CREATININE 0.93 0.86  CALCIUM  --  8.3*   PT/INR No results for input(s): LABPROT, INR in the last 72 hours. Comprehensive Metabolic Panel:    Component Value Date/Time   NA 138 02/13/2015 0550   K 3.9 02/13/2015 0550   CL 102 02/13/2015 0550   CO2 26 02/13/2015 0550   BUN 6 02/13/2015 0550   CREATININE 0.86 02/13/2015 0550   CREATININE 0.93 02/12/2015 1907   GLUCOSE 87 02/13/2015 0550   CALCIUM 8.3* 02/13/2015 0550    Studies/Results: Ct Abdomen Pelvis W Contrast  02/12/2015   CLINICAL DATA:  Acute generalized abdominal pain.  EXAM: CT ABDOMEN AND PELVIS WITH CONTRAST  TECHNIQUE: Multidetector CT imaging of the  abdomen and pelvis was performed using the standard protocol following bolus administration of intravenous contrast.  CONTRAST:  51mL OMNIPAQUE IOHEXOL 300 MG/ML SOLN, OMNIPAQUE IOHEXOL 300 MG/ML SOLN  COMPARISON:  None.  FINDINGS: Minimal bilateral posterior basilar subsegmental atelectasis is noted. 4.5 mm nodule is noted in right middle lobe. No significant osseous abnormality is noted.  Status post cholecystectomy. The liver, spleen and pancreas appear normal. Adrenal glands appear normal. Simple cyst is seen in upper pole of right kidney. Mild left hydroureteronephrosis is noted without obstructing calculus. This appears to be due to inflammation in the pelvis secondary to sigmoid diverticulitis. Several foci of soft tissue gas is seen in the surrounding peritoneal fat consistent with perforation. Small amount of free fluid is noted in the dependent portion the pelvis. Uterus and ovaries appear normal. Urinary bladder appears normal. There is no evidence of bowel obstruction. No significant adenopathy is noted.  IMPRESSION: 4.5 mm nodule seen in right middle lobe. If the patient is at high risk for bronchogenic carcinoma, follow-up chest CT at 1 year is recommended. If the patient is at low risk, no follow-up is needed. This recommendation follows the consensus statement: Guidelines for Management of Small Pulmonary Nodules Detected on CT Scans: A Statement from the Fleischner Society as published  in Radiology 2005; [id number].  Sigmoid diverticulitis is noted with soft tissue gas noted around the involved colon in the pelvis consistent with perforation. Mild left hydroureteronephrosis is noted most likely due to the surrounding inflammation. These results will be called to the ordering clinician or representative by the Radiologist Assistant, and communication documented in the PACS or zVision Dashboard.   Electronically Signed   By: Lupita Raider, M.D.   On: 02/12/2015 14:44     Anti-infectives: Anti-infectives    Start     Dose/Rate Route Frequency Ordered Stop   02/12/15 1700  piperacillin-tazobactam (ZOSYN) IVPB 3.375 g     3.375 g 12.5 mL/hr over 240 Minutes Intravenous Every 8 hours 02/12/15 1602     02/12/15 1600  metroNIDAZOLE (FLAGYL) IVPB 500 mg     500 mg 100 mL/hr over 60 Minutes Intravenous Every 8 hours 02/12/15 1559 02/19/15 1559   02/11/15 1500  cefoTEtan (CEFOTAN) 2 g in dextrose 5 % 50 mL IVPB  Status:  Discontinued     2 g 100 mL/hr over 30 Minutes Intravenous Every 12 hours 02/11/15 1355 02/12/15 1559   02/11/15 1400  ampicillin (OMNIPEN) 2 g in sodium chloride 0.9 % 50 mL IVPB  Status:  Discontinued     2 g 150 mL/hr over 20 Minutes Intravenous 4 times per day 02/11/15 1349 02/11/15 1354   02/11/15 1400  clindamycin (CLEOCIN) IVPB 900 mg  Status:  Discontinued     900 mg 100 mL/hr over 30 Minutes Intravenous 3 times per day 02/11/15 1349 02/11/15 1354   02/11/15 1400  doxycycline (VIBRAMYCIN) 100 mg in dextrose 5 % 250 mL IVPB  Status:  Discontinued     100 mg 125 mL/hr over 120 Minutes Intravenous Every 12 hours 02/11/15 1355 02/12/15 1559      Assessment & Plans: Focally perforated diverticulitis On antibiotics (Zosyn and Flagyl)  Begin clear liquid diet OOB, ambulate in halls  Tylenol for HA  Carla Heckler, MD, [facility] Office: [phone]   Carla Herrera Judie Petit 02/13/2015

## 2015-02-14 ENCOUNTER — Inpatient Hospital Stay (HOSPITAL_COMMUNITY): Payer: Self-pay

## 2015-02-14 LAB — BASIC METABOLIC PANEL
ANION GAP: 6 (ref 5–15)
CALCIUM: 8.3 mg/dL — AB (ref 8.9–10.3)
CO2: 28 mmol/L (ref 22–32)
CREATININE: 0.77 mg/dL (ref 0.44–1.00)
Chloride: 102 mmol/L (ref 101–111)
GFR calc Af Amer: >60 mL/min (ref >60)
GFR calc non Af Amer: >60 mL/min (ref >60)
GLUCOSE: 119 mg/dL — AB (ref 65–99)
Potassium: 4 mmol/L (ref 3.5–5.1)
Sodium: 136 mmol/L (ref 135–145)

## 2015-02-14 LAB — CBC
HEMATOCRIT: 33.8 % — AB (ref 36.0–46.0)
Hemoglobin: 10.9 g/dL — ABNORMAL LOW (ref 12.0–15.0)
MCH: 30.4 pg (ref 26.0–34.0)
MCHC: 32.2 g/dL (ref 30.0–36.0)
MCV: 94.2 fL (ref 78.0–100.0)
Platelets: 194 10*3/uL (ref 150–400)
RBC: 3.59 MIL/uL — AB (ref 3.87–5.11)
RDW: 13.1 % (ref 11.5–15.5)
WBC: 5.8 10*3/uL (ref 4.0–10.5)

## 2015-02-14 MED ORDER — NALOXONE HCL 0.4 MG/ML IJ SOLN: 0.4000 mg | INTRAMUSCULAR | Status: DC | PRN

## 2015-02-14 MED ORDER — DIPHENHYDRAMINE HCL 12.5 MG/5ML PO ELIX: 12.5000 mg | ORAL_SOLUTION | Freq: Four times a day (QID) | ORAL | Status: DC | PRN

## 2015-02-14 MED ORDER — ONDANSETRON HCL 4 MG/2ML IJ SOLN
4.0000 mg | Freq: Four times a day (QID) | INTRAMUSCULAR | Status: DC | PRN
  Administered 2015-02-14: 4 mg via INTRAVENOUS
  Filled 2015-02-14: qty 2

## 2015-02-14 MED ORDER — DIPHENHYDRAMINE HCL 50 MG/ML IJ SOLN: 12.5000 mg | Freq: Four times a day (QID) | INTRAMUSCULAR | Status: DC | PRN

## 2015-02-14 MED ORDER — SODIUM CHLORIDE 0.9 % IV SOLN
1.0000 g | INTRAVENOUS | Status: DC
  Administered 2015-02-14 – 2015-02-21 (×8): 1 g via INTRAVENOUS
  Filled 2015-02-14 (×10): qty 1

## 2015-02-14 MED ORDER — HYDROMORPHONE 0.3 MG/ML IV SOLN
INTRAVENOUS | Status: DC
  Administered 2015-02-14: 1.8 mg via INTRAVENOUS
  Administered 2015-02-14 (×2): 0.3 mg via INTRAVENOUS
  Administered 2015-02-14: 1.8 mg via INTRAVENOUS
  Administered 2015-02-15: 1.5 mg via INTRAVENOUS
  Administered 2015-02-15: 0.3 mg via INTRAVENOUS
  Administered 2015-02-15: 0.9 mg via INTRAVENOUS
  Filled 2015-02-14 (×3): qty 25

## 2015-02-14 MED ORDER — PROMETHAZINE HCL 25 MG/ML IJ SOLN: 6.2500 mg | Freq: Four times a day (QID) | INTRAMUSCULAR | Status: DC | PRN

## 2015-02-14 MED ORDER — SODIUM CHLORIDE 0.9 % IJ SOLN: 9.0000 mL | INTRAMUSCULAR | Status: DC | PRN

## 2015-02-14 NOTE — Progress Notes (Signed)
Patient ID: Carla Herrera, female   DOB: 1965/11/13, 49 y.o.   MRN: [id number]    Subjective: Pt with acutely worsening abdominal pain today after having a small BM.  Significant nausea that started yesterday.  Reduced dose dilaudid PCA not controlling pain at this time.  Objective: Vital signs in last 24 hours: Temp:  [97.7 F (36.5 C)-98.9 F (37.2 C)] 98.9 F (37.2 C) (06/20 0950) Pulse Rate:  [57-82] 59 (06/20 0950) Resp:  [10-18] 14 (06/20 0950) BP: (88-115)/(40-66) 93/46 mmHg (06/20 0950) SpO2:  [98 %-100 %] 100 % (06/20 0950) Weight:  [101.651 kg (224 lb 1.6 oz)] 101.651 kg (224 lb 1.6 oz) (06/20 0530) Last BM Date: 02/12/15  Intake/Output from previous day: 06/19 0701 - 06/20 0700 In: 5617.5 [P.O.:360; I.V.:4807.5; IV Piggyback:450] Out: 3001 [Urine:3000; Emesis/NG output:1] Intake/Output this shift: Total I/O In: 612.5 [I.V.:612.5] Out: -   PE: Abd: soft, but very tender in lower abdomen, greatest in LLQ, but voluntary guarding with palpation to both lower quadrants.  Hypoactive BS, obese Heart: regular Lungs: CTAB  Lab Results:   Recent Labs  02/12/15 1907 02/13/15 0550  WBC 12.7* 10.2  HGB 11.7* 11.3*  HCT 36.1 35.5*  PLT 201 192   BMET  Recent Labs  02/12/15 1907 02/13/15 0550  NA  --  138  K  --  3.9  CL  --  102  CO2  --  26  GLUCOSE  --  87  BUN  --  6  CREATININE 0.93 0.86  CALCIUM  --  8.3*   PT/INR No results for input(s): LABPROT, INR in the last 72 hours. CMP     Component Value Date/Time   NA 138 02/13/2015 0550   K 3.9 02/13/2015 0550   CL 102 02/13/2015 0550   CO2 26 02/13/2015 0550   GLUCOSE 87 02/13/2015 0550   BUN 6 02/13/2015 0550   CREATININE 0.86 02/13/2015 0550   CALCIUM 8.3* 02/13/2015 0550   GFRNONAA >60 02/13/2015 0550   GFRAA >60 02/13/2015 0550   Lipase  No results found for: LIPASE     Studies/Results: Ct Abdomen Pelvis W Contrast  02/12/2015   CLINICAL DATA:  Acute generalized abdominal pain.  EXAM:  CT ABDOMEN AND PELVIS WITH CONTRAST  TECHNIQUE: Multidetector CT imaging of the abdomen and pelvis was performed using the standard protocol following bolus administration of intravenous contrast.  CONTRAST:  50mL OMNIPAQUE IOHEXOL 300 MG/ML SOLN, OMNIPAQUE IOHEXOL 300 MG/ML SOLN  COMPARISON:  None.  FINDINGS: Minimal bilateral posterior basilar subsegmental atelectasis is noted. 4.5 mm nodule is noted in right middle lobe. No significant osseous abnormality is noted.  Status post cholecystectomy. The liver, spleen and pancreas appear normal. Adrenal glands appear normal. Simple cyst is seen in upper pole of right kidney. Mild left hydroureteronephrosis is noted without obstructing calculus. This appears to be due to inflammation in the pelvis secondary to sigmoid diverticulitis. Several foci of soft tissue gas is seen in the surrounding peritoneal fat consistent with perforation. Small amount of free fluid is noted in the dependent portion the pelvis. Uterus and ovaries appear normal. Urinary bladder appears normal. There is no evidence of bowel obstruction. No significant adenopathy is noted.  IMPRESSION: 4.5 mm nodule seen in right middle lobe. If the patient is at high risk for bronchogenic carcinoma, follow-up chest CT at 1 year is recommended. If the patient is at low risk, no follow-up is needed. This recommendation follows the consensus statement: Guidelines for Management of Small  Pulmonary Nodules Detected on CT Scans: A Statement from the Fleischner Society as published in Radiology 2005; 237:395-400.  Sigmoid diverticulitis is noted with soft tissue gas noted around the involved colon in the pelvis consistent with perforation. Mild left hydroureteronephrosis is noted most likely due to the surrounding inflammation. These results will be called to the ordering clinician or representative by the Radiologist Assistant, and communication documented in the PACS or zVision Dashboard.   Electronically  Signed   By: Lupita Raider, M.D.   On: 02/12/2015 14:44    Anti-infectives: Anti-infectives    Start     Dose/Rate Route Frequency Ordered Stop   02/12/15 1700  piperacillin-tazobactam (ZOSYN) IVPB 3.375 g     3.375 g 12.5 mL/hr over 240 Minutes Intravenous Every 8 hours 02/12/15 1602     02/12/15 1600  metroNIDAZOLE (FLAGYL) IVPB 500 mg     500 mg 100 mL/hr over 60 Minutes Intravenous Every 8 hours 02/12/15 1559 02/19/15 1559   02/11/15 1500  cefoTEtan (CEFOTAN) 2 g in dextrose 5 % 50 mL IVPB  Status:  Discontinued     2 g 100 mL/hr over 30 Minutes Intravenous Every 12 hours 02/11/15 1355 02/12/15 1559   02/11/15 1400  ampicillin (OMNIPEN) 2 g in sodium chloride 0.9 % 50 mL IVPB  Status:  Discontinued     2 g 150 mL/hr over 20 Minutes Intravenous 4 times per day 02/11/15 1349 02/11/15 1354   02/11/15 1400  clindamycin (CLEOCIN) IVPB 900 mg  Status:  Discontinued     900 mg 100 mL/hr over 30 Minutes Intravenous 3 times per day 02/11/15 1349 02/11/15 1354   02/11/15 1400  doxycycline (VIBRAMYCIN) 100 mg in dextrose 5 % 250 mL IVPB  Status:  Discontinued     100 mg 125 mL/hr over 120 Minutes Intravenous Every 12 hours 02/11/15 1355 02/12/15 1559       Assessment/Plan  1. Diverticulitis with microperforation -acutely worsening pain today.  No peritonitis currently, but concerning abdominal exam.  Will check a plain film to rule out free air given acutely worsening pain -add phenergan for additional nausea control -STAT labs  -change PCA to full dose for better pain control -NPO -I told the patient is we can not get her pain controlled and treat her conservatively, she may require an operation resulting in a colostomy.  She understands.  LOS: 3 days    Myiah Petkus E 02/14/2015, 11:03 AM Pager: [phone]

## 2015-02-14 NOTE — Clinical Documentation Improvement (Signed)
MD's, NP's, and PA's   Noted on admit patient had WBC (17.5), temperature ( 100.1-101.1), and pulse ( 91-98) was on Doxycycline and Flagyl as outpatient for PID, upon admit started on IV antibiotics.  If either of the following diagnoses are appropriate for this admission please document in notes.  Thank you    Possible Clinical Conditions?  Septicemia / Sepsis, POA  SIRS, POA  Other Condition   Cannot clinically Determine     Risk Factors: PID , h/o Bilateral Tubal Ligation, appendectomy,   Diagnostics : Blood cultures pending   Treatment: IV Flagyl, Zosyn  Thank You, Lavonda Jumbo ,RN Clinical Documentation Specialist:  [phone]  [facility]- Health Information Management

## 2015-02-15 LAB — CBC
HCT: 36 % (ref 36.0–46.0)
HEMOGLOBIN: 11.6 g/dL — AB (ref 12.0–15.0)
MCH: 30.8 pg (ref 26.0–34.0)
MCHC: 32.2 g/dL (ref 30.0–36.0)
MCV: 95.5 fL (ref 78.0–100.0)
Platelets: 211 10*3/uL (ref 150–400)
RBC: 3.77 MIL/uL — ABNORMAL LOW (ref 3.87–5.11)
RDW: 13.3 % (ref 11.5–15.5)
WBC: 7.3 10*3/uL (ref 4.0–10.5)

## 2015-02-15 MED ORDER — HYDROMORPHONE HCL 1 MG/ML IJ SOLN
0.5000 mg | INTRAMUSCULAR | Status: DC | PRN
  Administered 2015-02-15 – 2015-02-16 (×5): 1 mg via INTRAVENOUS
  Filled 2015-02-15 (×5): qty 1

## 2015-02-15 MED ORDER — ONDANSETRON HCL 4 MG/2ML IJ SOLN
4.0000 mg | INTRAMUSCULAR | Status: DC | PRN
  Administered 2015-02-15 – 2015-02-17 (×5): 4 mg via INTRAVENOUS
  Filled 2015-02-15 (×4): qty 2

## 2015-02-15 MED ORDER — PANTOPRAZOLE SODIUM 40 MG IV SOLR
40.0000 mg | Freq: Two times a day (BID) | INTRAVENOUS | Status: DC
  Administered 2015-02-15 – 2015-02-17 (×5): 40 mg via INTRAVENOUS
  Filled 2015-02-15 (×6): qty 40

## 2015-02-15 NOTE — Progress Notes (Signed)
Patient ID: Annia Belt, female   DOB: [birth year], 49 y.o.   MRN: [id number]    Subjective: Still having pain, but has only used 1.5mg  of dilaudid in about 4 hours.  Better than yesterday.  Nausea is improved since she is NPO.  No other BMs  Objective: Vital signs in last 24 hours: Temp:  [98 F (36.7 C)-98.6 F (37 C)] 98.6 F (37 C) (06/21 1005) Pulse Rate:  [56-83] 77 (06/21 1005) Resp:  [9-18] 11 (06/21 1005) BP: (79-109)/(35-60) 79/42 mmHg (06/21 1005) SpO2:  [97 %-99 %] 99 % (06/21 1005) Weight:  [103.42 kg (228 lb)] 103.42 kg (228 lb) (06/21 0154) Last BM Date: 02/13/15  Intake/Output from previous day: 06/20 0701 - 06/21 0700 In: 2270 [I.V.:2270] Out: 1560 [Urine:1560] Intake/Output this shift: Total I/O In: 490 [I.V.:490] Out: 500 [Urine:500]  PE: Abd: soft, still quite tender, but less than yesterday.  Still greatest is in LLQ.  Some BS, ND Heart: regular Lungs: CTAB  Lab Results:   Recent Labs  02/14/15 1144 02/15/15 0820  WBC 5.8 7.3  HGB 10.9* 11.6*  HCT 33.8* 36.0  PLT 194 211   BMET  Recent Labs  02/13/15 0550 02/14/15 1144  NA 138 136  K 3.9 4.0  CL 102 102  CO2 26 28  GLUCOSE 87 119*  BUN 6 <5*  CREATININE 0.86 0.77  CALCIUM 8.3* 8.3*   PT/INR No results for input(s): LABPROT, INR in the last 72 hours. CMP     Component Value Date/Time   NA 136 02/14/2015 1144   K 4.0 02/14/2015 1144   CL 102 02/14/2015 1144   CO2 28 02/14/2015 1144   GLUCOSE 119* 02/14/2015 1144   BUN <5* 02/14/2015 1144   CREATININE 0.77 02/14/2015 1144   CALCIUM 8.3* 02/14/2015 1144   GFRNONAA >60 02/14/2015 1144   GFRAA >60 02/14/2015 1144   Lipase  No results found for: LIPASE     Studies/Results: Dg Abd Portable 1v  02/14/2015   CLINICAL DATA:  Right lower quadrant abdominal pain. Evidence of sigmoid colon diverticulitis with microperforation on recent CT.  EXAM: PORTABLE ABDOMEN - 1 VIEW  COMPARISON:  CT abdomen and pelvis 02/12/2015  FINDINGS:  Residual oral contrast material is present in the colon. No dilated loops of bowel are seen to suggest obstruction. No gross intraperitoneal free air is identified, however evaluation is limited by supine only imaging. Right upper quadrant abdominal surgical clips are noted. Subsegmental atelectasis is noted in the lung bases. No acute osseous abnormality is identified.  IMPRESSION: Nonobstructed bowel-gas pattern.   Electronically Signed   By: Sebastian Ache   On: 02/14/2015 11:54    Anti-infectives: Anti-infectives    Start     Dose/Rate Route Frequency Ordered Stop   02/14/15 1500  ertapenem (INVANZ) 1 g in sodium chloride 0.9 % 50 mL IVPB     1 g 100 mL/hr over 30 Minutes Intravenous Every 24 hours 02/14/15 1405     02/12/15 1700  piperacillin-tazobactam (ZOSYN) IVPB 3.375 g  Status:  Discontinued     3.375 g 12.5 mL/hr over 240 Minutes Intravenous Every 8 hours 02/12/15 1602 02/14/15 1405   02/12/15 1600  metroNIDAZOLE (FLAGYL) IVPB 500 mg  Status:  Discontinued     500 mg 100 mL/hr over 60 Minutes Intravenous Every 8 hours 02/12/15 1559 02/14/15 1405   02/11/15 1500  cefoTEtan (CEFOTAN) 2 g in dextrose 5 % 50 mL IVPB  Status:  Discontinued     2  g 100 mL/hr over 30 Minutes Intravenous Every 12 hours 02/11/15 1355 02/12/15 1559   02/11/15 1400  ampicillin (OMNIPEN) 2 g in sodium chloride 0.9 % 50 mL IVPB  Status:  Discontinued     2 g 150 mL/hr over 20 Minutes Intravenous 4 times per day 02/11/15 1349 02/11/15 1354   02/11/15 1400  clindamycin (CLEOCIN) IVPB 900 mg  Status:  Discontinued     900 mg 100 mL/hr over 30 Minutes Intravenous 3 times per day 02/11/15 1349 02/11/15 1354   02/11/15 1400  doxycycline (VIBRAMYCIN) 100 mg in dextrose 5 % 250 mL IVPB  Status:  Discontinued     100 mg 125 mL/hr over 120 Minutes Intravenous Every 12 hours 02/11/15 1355 02/12/15 1559       Assessment/Plan  1. Diverticulitis with microperforation, HD 4 -patient a little better today, but still  with pain.  No WBC or fevers.  Cont with conservative management for now.  Will plan on repeat CT on Thursday.  Cont NPO -Her BP was low at last check at 79/42.  I do not think she has sepsis at this time, I think this is likely from her pain meds.  We are manually checking it right now.  If it remains this low, then I will stop her PCA and make her dilaudid pushes to help bring this back up.  She is already on 150cc of fluid per hr.   ADDENDUM: repeat pressure 88/50s.  Will [facility] PCA since she is not monitored.  Will write for IV push and spoke to the RN to make sure that her BP is monitored more frequently before given pain meds.  There is a hold parameter for no pain meds with BP < 90.  LOS: 4 days    Adamary Savary E 02/15/2015, 10:55 AM Pager: [phone]

## 2015-02-16 ENCOUNTER — Encounter (HOSPITAL_COMMUNITY): Payer: Self-pay | Admitting: Radiology

## 2015-02-16 ENCOUNTER — Inpatient Hospital Stay (HOSPITAL_COMMUNITY): Payer: Self-pay

## 2015-02-16 LAB — CBC
HCT: 34.8 % — ABNORMAL LOW (ref 36.0–46.0)
Hemoglobin: 11.2 g/dL — ABNORMAL LOW (ref 12.0–15.0)
MCH: 30.9 pg (ref 26.0–34.0)
MCHC: 32.2 g/dL (ref 30.0–36.0)
MCV: 95.9 fL (ref 78.0–100.0)
Platelets: 208 10*3/uL (ref 150–400)
RBC: 3.63 MIL/uL — AB (ref 3.87–5.11)
RDW: 13.4 % (ref 11.5–15.5)
WBC: 6.8 10*3/uL (ref 4.0–10.5)

## 2015-02-16 LAB — CLOSTRIDIUM DIFFICILE BY PCR: Toxigenic C. Difficile by PCR: NEGATIVE

## 2015-02-16 LAB — BASIC METABOLIC PANEL
ANION GAP: 3 — AB (ref 5–15)
BUN: <5 mg/dL — ABNORMAL LOW (ref 6–20)
CALCIUM: 8.4 mg/dL — AB (ref 8.9–10.3)
CO2: 30 mmol/L (ref 22–32)
CREATININE: 0.75 mg/dL (ref 0.44–1.00)
Chloride: 103 mmol/L (ref 101–111)
GFR calc non Af Amer: >60 mL/min (ref >60)
Glucose, Bld: 111 mg/dL — ABNORMAL HIGH (ref 65–99)
Potassium: 4.1 mmol/L (ref 3.5–5.1)
Sodium: 136 mmol/L (ref 135–145)

## 2015-02-16 MED ORDER — CHLORHEXIDINE GLUCONATE CLOTH 2 % EX PADS
6.0000 | MEDICATED_PAD | Freq: Once | CUTANEOUS | Status: AC
  Administered 2015-02-16: 6 via TOPICAL

## 2015-02-16 MED ORDER — DEXTROSE 5 % IV SOLN
2.0000 g | INTRAVENOUS | Status: DC
  Filled 2015-02-16: qty 2

## 2015-02-16 MED ORDER — IOHEXOL 300 MG/ML  SOLN
100.0000 mL | Freq: Once | INTRAMUSCULAR | Status: AC | PRN
  Administered 2015-02-16: 100 mL via INTRAVENOUS

## 2015-02-16 MED ORDER — IOHEXOL 300 MG/ML  SOLN
25.0000 mL | INTRAMUSCULAR | Status: AC
  Administered 2015-02-16 (×2): 25 mL via ORAL

## 2015-02-16 MED ORDER — CHLORHEXIDINE GLUCONATE CLOTH 2 % EX PADS
6.0000 | MEDICATED_PAD | Freq: Once | CUTANEOUS | Status: AC
  Administered 2015-02-16: 6 via TOPICAL
  Filled 2015-02-16: qty 6

## 2015-02-16 NOTE — Progress Notes (Signed)
Patient ID: Carla Herrera, female   DOB: 1965/11/30, 49 y.o.   MRN: [id number]    Subjective: Pt tearful this morning.  Had diarrhea all night apparently, no blood.  Feels dizzy and lightheaded when she stands up.  Gets more nauseated when she stands up.  Objective: Vital signs in last 24 hours: Temp:  [98.1 F (36.7 C)-98.7 F (37.1 C)] 98.7 F (37.1 C) (06/22 0600) Pulse Rate:  [59-82] 59 (06/22 0600) Resp:  [11-18] 18 (06/22 0600) BP: (79-111)/(42-62) 111/58 mmHg (06/22 0600) SpO2:  [93 %-99 %] 95 % (06/22 0600) Weight:  [102.7 kg (226 lb 6.6 oz)] 102.7 kg (226 lb 6.6 oz) (06/22 0800) Last BM Date: 02/16/15  Intake/Output from previous day: 06/21 0701 - 06/22 0700 In: 1075 [I.V.:1075] Out: 800 [Urine:500; Stool:300] Intake/Output this shift:    PE: Abd: soft, still very tender in the LLQ and mildly in RLQ, few BS, obese Heart: regular Lungs: CTAB  Lab Results:   Recent Labs  02/15/15 0820 02/16/15 0453  WBC 7.3 6.8  HGB 11.6* 11.2*  HCT 36.0 34.8*  PLT 211 208   BMET  Recent Labs  02/14/15 1144 02/16/15 0453  NA 136 136  K 4.0 4.1  CL 102 103  CO2 28 30  GLUCOSE 119* 111*  BUN <5* <5*  CREATININE 0.77 0.75  CALCIUM 8.3* 8.4*   PT/INR No results for input(s): LABPROT, INR in the last 72 hours. CMP     Component Value Date/Time   NA 136 02/16/2015 0453   K 4.1 02/16/2015 0453   CL 103 02/16/2015 0453   CO2 30 02/16/2015 0453   GLUCOSE 111* 02/16/2015 0453   BUN <5* 02/16/2015 0453   CREATININE 0.75 02/16/2015 0453   CALCIUM 8.4* 02/16/2015 0453   GFRNONAA >60 02/16/2015 0453   GFRAA >60 02/16/2015 0453   Lipase  No results found for: LIPASE     Studies/Results: Dg Abd Portable 1v  02/14/2015   CLINICAL DATA:  Right lower quadrant abdominal pain. Evidence of sigmoid colon diverticulitis with microperforation on recent CT.  EXAM: PORTABLE ABDOMEN - 1 VIEW  COMPARISON:  CT abdomen and pelvis 02/12/2015  FINDINGS: Residual oral contrast  material is present in the colon. No dilated loops of bowel are seen to suggest obstruction. No gross intraperitoneal free air is identified, however evaluation is limited by supine only imaging. Right upper quadrant abdominal surgical clips are noted. Subsegmental atelectasis is noted in the lung bases. No acute osseous abnormality is identified.  IMPRESSION: Nonobstructed bowel-gas pattern.   Electronically Signed   By: Sebastian Ache   On: 02/14/2015 11:54    Anti-infectives: Anti-infectives    Start     Dose/Rate Route Frequency Ordered Stop   02/14/15 1500  ertapenem (INVANZ) 1 g in sodium chloride 0.9 % 50 mL IVPB     1 g 100 mL/hr over 30 Minutes Intravenous Every 24 hours 02/14/15 1405     02/12/15 1700  piperacillin-tazobactam (ZOSYN) IVPB 3.375 g  Status:  Discontinued     3.375 g 12.5 mL/hr over 240 Minutes Intravenous Every 8 hours 02/12/15 1602 02/14/15 1405   02/12/15 1600  metroNIDAZOLE (FLAGYL) IVPB 500 mg  Status:  Discontinued     500 mg 100 mL/hr over 60 Minutes Intravenous Every 8 hours 02/12/15 1559 02/14/15 1405   02/11/15 1500  cefoTEtan (CEFOTAN) 2 g in dextrose 5 % 50 mL IVPB  Status:  Discontinued     2 g 100 mL/hr over 30 Minutes  Intravenous Every 12 hours 02/11/15 1355 02/12/15 1559   02/11/15 1400  ampicillin (OMNIPEN) 2 g in sodium chloride 0.9 % 50 mL IVPB  Status:  Discontinued     2 g 150 mL/hr over 20 Minutes Intravenous 4 times per day 02/11/15 1349 02/11/15 1354   02/11/15 1400  clindamycin (CLEOCIN) IVPB 900 mg  Status:  Discontinued     900 mg 100 mL/hr over 30 Minutes Intravenous 3 times per day 02/11/15 1349 02/11/15 1354   02/11/15 1400  doxycycline (VIBRAMYCIN) 100 mg in dextrose 5 % 250 mL IVPB  Status:  Discontinued     100 mg 125 mL/hr over 120 Minutes Intravenous Every 12 hours 02/11/15 1355 02/12/15 1559       Assessment/Plan   1. Diverticulitis with microperforation, HD 5 -patient tearful today.  She had diarrhea all night.  Despite  change to Invanz 2 days ago, the patient does not seem to have made much significant progress.  Her vitals and labs all still remain normal, though.  We will repeat her CT scan today instead of tomorrow.  The patient may likely require a Luz Brazen procedure for inability to improve with conservative management. -if diarrhea persists, she may need to be checked for c diff given abx use, but likely secondary to her diverticulitis right now.  LOS: 5 days    Sonakshi Rolland E 02/16/2015, 8:46 AM Pager: [phone]

## 2015-02-16 NOTE — Consult Note (Signed)
WOC requested for preoperative stoma site marking  Discussed surgical procedure and stoma creation with patient.   Explained role of the WOC nurse team.  Provided the patient with educational booklet/DVD and provided samples of pouching options.  Answered patient and family questions.   Examined patient lying, sitting, and standing in order to place the marking in the patient's visual field, away from any creases or abdominal contour issues and within the rectus muscle.  Attempted to mark below the patient's belt line.    Marked for colostomy in the LLQ  _4___ cm to the left of the umbilicus and _7___cm above/below the umbilicus.   Covered mark with thin film transparent dressing to preserve mark until date of surgery  WOC team will follow up with patient after surgery for continue ostomy care and teaching.  Brayden Brodhead [place] RN,CWOCN [phone]

## 2015-02-17 ENCOUNTER — Inpatient Hospital Stay (HOSPITAL_COMMUNITY): Payer: Self-pay | Admitting: Certified Registered"

## 2015-02-17 ENCOUNTER — Encounter (HOSPITAL_COMMUNITY): Admission: AD | Disposition: A | Discharge status: Home Health | Payer: Self-pay | Source: Ambulatory Visit

## 2015-02-17 ENCOUNTER — Encounter (HOSPITAL_COMMUNITY): Payer: Self-pay | Admitting: Anesthesiology

## 2015-02-17 DIAGNOSIS — K5732 Diverticulitis of large intestine without perforation or abscess without bleeding: Secondary | ICD-10-CM | POA: Diagnosis present

## 2015-02-17 HISTORY — PX: LAPAROSCOPIC PARTIAL COLECTOMY: SHX5907

## 2015-02-17 LAB — CBC WITH DIFFERENTIAL/PLATELET
BASOS ABS: 0 10*3/uL (ref 0.0–0.1)
BASOS PCT: 0 % (ref 0–1)
Eosinophils Absolute: 0.1 10*3/uL (ref 0.0–0.7)
Eosinophils Relative: 1 % (ref 0–5)
HCT: 33.2 % — ABNORMAL LOW (ref 36.0–46.0)
HEMOGLOBIN: 11 g/dL — AB (ref 12.0–15.0)
Lymphocytes Relative: 27 % (ref 12–46)
Lymphs Abs: 1.9 10*3/uL (ref 0.7–4.0)
MCH: 31.3 pg (ref 26.0–34.0)
MCHC: 33.1 g/dL (ref 30.0–36.0)
MCV: 94.6 fL (ref 78.0–100.0)
Monocytes Absolute: 0.6 10*3/uL (ref 0.1–1.0)
Monocytes Relative: 8 % (ref 3–12)
NEUTROS PCT: 64 % (ref 43–77)
Neutro Abs: 4.4 10*3/uL (ref 1.7–7.7)
PLATELETS: 205 10*3/uL (ref 150–400)
RBC: 3.51 MIL/uL — AB (ref 3.87–5.11)
RDW: 13.4 % (ref 11.5–15.5)
WBC: 7 10*3/uL (ref 4.0–10.5)

## 2015-02-17 LAB — TYPE AND SCREEN
ABO/RH(D): O POS
Antibody Screen: NEGATIVE

## 2015-02-17 LAB — BASIC METABOLIC PANEL
ANION GAP: 6 (ref 5–15)
BUN: <5 mg/dL — ABNORMAL LOW (ref 6–20)
CO2: 29 mmol/L (ref 22–32)
Calcium: 8.3 mg/dL — ABNORMAL LOW (ref 8.9–10.3)
Chloride: 105 mmol/L (ref 101–111)
Creatinine, Ser: 0.79 mg/dL (ref 0.44–1.00)
GFR calc Af Amer: >60 mL/min (ref >60)
GFR calc non Af Amer: >60 mL/min (ref >60)
GLUCOSE: 122 mg/dL — AB (ref 65–99)
POTASSIUM: 4 mmol/L (ref 3.5–5.1)
Sodium: 140 mmol/L (ref 135–145)

## 2015-02-17 LAB — SURGICAL PCR SCREEN
MRSA, PCR: NEGATIVE
STAPHYLOCOCCUS AUREUS: NEGATIVE

## 2015-02-17 LAB — ABO/RH: ABO/RH(D): O POS

## 2015-02-17 SURGERY — LAPAROSCOPIC PARTIAL COLECTOMY
Anesthesia: General

## 2015-02-17 MED ORDER — FENTANYL CITRATE (PF) 100 MCG/2ML IJ SOLN
INTRAMUSCULAR | Status: DC | PRN
  Administered 2015-02-17 (×2): 50 ug via INTRAVENOUS
  Administered 2015-02-17: 100 ug via INTRAVENOUS

## 2015-02-17 MED ORDER — HEPARIN SODIUM (PORCINE) 5000 UNIT/ML IJ SOLN
5000.0000 [IU] | Freq: Three times a day (TID) | INTRAMUSCULAR | Status: DC
Start: 2015-02-17 — End: 2015-02-22
  Administered 2015-02-17 – 2015-02-22 (×14): 5000 [IU] via SUBCUTANEOUS
  Filled 2015-02-17 (×18): qty 1

## 2015-02-17 MED ORDER — HYDROMORPHONE HCL 1 MG/ML IJ SOLN
INTRAMUSCULAR | Status: AC
  Filled 2015-02-17: qty 1

## 2015-02-17 MED ORDER — MIDAZOLAM HCL 5 MG/5ML IJ SOLN
INTRAMUSCULAR | Status: DC | PRN
  Administered 2015-02-17: 2 mg via INTRAVENOUS

## 2015-02-17 MED ORDER — GLYCOPYRROLATE 0.2 MG/ML IJ SOLN
INTRAMUSCULAR | Status: DC | PRN
  Administered 2015-02-17: 0.6 mg via INTRAVENOUS

## 2015-02-17 MED ORDER — DIPHENHYDRAMINE HCL 50 MG/ML IJ SOLN: 12.5000 mg | Freq: Four times a day (QID) | INTRAMUSCULAR | Status: DC | PRN

## 2015-02-17 MED ORDER — NEOSTIGMINE METHYLSULFATE 10 MG/10ML IV SOLN
INTRAVENOUS | Status: DC | PRN
  Administered 2015-02-17: 4 mg via INTRAVENOUS

## 2015-02-17 MED ORDER — HYDROMORPHONE 0.3 MG/ML IV SOLN
INTRAVENOUS | Status: DC
  Administered 2015-02-17: 0.3 mg via INTRAVENOUS
  Administered 2015-02-18: 3 mg via INTRAVENOUS
  Administered 2015-02-18: 1.8 mg via INTRAVENOUS
  Administered 2015-02-18: 4.2 mg via INTRAVENOUS
  Administered 2015-02-18: 5 mg via INTRAVENOUS
  Administered 2015-02-18 (×2): via INTRAVENOUS
  Administered 2015-02-18: 4.34 mg via INTRAVENOUS
  Administered 2015-02-19: 2.1 mg via INTRAVENOUS
  Administered 2015-02-19: 10:00:00 via INTRAVENOUS
  Administered 2015-02-19: 3 mg via INTRAVENOUS
  Administered 2015-02-19: 2.1 mg via INTRAVENOUS
  Administered 2015-02-19: 1.8 mg via INTRAVENOUS
  Administered 2015-02-20: 0.9 mg via INTRAVENOUS
  Administered 2015-02-20: 2.26 mg via INTRAVENOUS
  Administered 2015-02-20: 2.4 mg via INTRAVENOUS
  Administered 2015-02-20: 0.3 mg via INTRAVENOUS
  Administered 2015-02-20: 2.1 mg via INTRAVENOUS
  Administered 2015-02-20: 17:00:00 via INTRAVENOUS
  Administered 2015-02-20: 2.1 mg via INTRAVENOUS
  Administered 2015-02-21: 0.3 mg via INTRAVENOUS
  Administered 2015-02-21: 1.2 mg via INTRAVENOUS
  Filled 2015-02-17 (×7): qty 25

## 2015-02-17 MED ORDER — MORPHINE SULFATE 1 MG/ML IV SOLN
INTRAVENOUS | Status: DC
  Administered 2015-02-17: 17:00:00 via INTRAVENOUS

## 2015-02-17 MED ORDER — HYDROMORPHONE HCL 1 MG/ML IJ SOLN
INTRAMUSCULAR | Status: DC | PRN
  Administered 2015-02-17 (×2): 1 mg via INTRAVENOUS

## 2015-02-17 MED ORDER — FENTANYL CITRATE (PF) 100 MCG/2ML IJ SOLN
INTRAMUSCULAR | Status: AC
  Filled 2015-02-17: qty 2

## 2015-02-17 MED ORDER — BUPIVACAINE HCL (PF) 0.5 % IJ SOLN
INTRAMUSCULAR | Status: AC
  Filled 2015-02-17: qty 30

## 2015-02-17 MED ORDER — ACETAMINOPHEN 10 MG/ML IV SOLN
1000.0000 mg | Freq: Once | INTRAVENOUS | Status: AC
  Administered 2015-02-17: 1000 mg via INTRAVENOUS

## 2015-02-17 MED ORDER — PROPOFOL 10 MG/ML IV BOLUS
INTRAVENOUS | Status: DC | PRN
  Administered 2015-02-17: 150 mg via INTRAVENOUS

## 2015-02-17 MED ORDER — ONDANSETRON HCL 4 MG/2ML IJ SOLN: 4.0000 mg | Freq: Four times a day (QID) | INTRAMUSCULAR | Status: DC | PRN

## 2015-02-17 MED ORDER — ROCURONIUM BROMIDE 100 MG/10ML IV SOLN
INTRAVENOUS | Status: AC
  Filled 2015-02-17: qty 2

## 2015-02-17 MED ORDER — DIPHENHYDRAMINE HCL 12.5 MG/5ML PO ELIX: 12.5000 mg | ORAL_SOLUTION | Freq: Four times a day (QID) | ORAL | Status: DC | PRN

## 2015-02-17 MED ORDER — LACTATED RINGERS IR SOLN
Status: DC | PRN
  Administered 2015-02-17: 1

## 2015-02-17 MED ORDER — SODIUM CHLORIDE 0.9 % IJ SOLN: 9.0000 mL | INTRAMUSCULAR | Status: DC | PRN

## 2015-02-17 MED ORDER — PROPOFOL 10 MG/ML IV BOLUS
INTRAVENOUS | Status: AC
  Filled 2015-02-17: qty 20

## 2015-02-17 MED ORDER — ONDANSETRON HCL 4 MG/2ML IJ SOLN
4.0000 mg | Freq: Four times a day (QID) | INTRAMUSCULAR | Status: DC | PRN
  Administered 2015-02-20: 4 mg via INTRAVENOUS
  Filled 2015-02-17: qty 2

## 2015-02-17 MED ORDER — LIP MEDEX EX OINT
TOPICAL_OINTMENT | CUTANEOUS | Status: AC
  Filled 2015-02-17: qty 7

## 2015-02-17 MED ORDER — BUPIVACAINE HCL (PF) 0.5 % IJ SOLN
INTRAMUSCULAR | Status: DC | PRN
  Administered 2015-02-17: 13 mL

## 2015-02-17 MED ORDER — FENTANYL CITRATE (PF) 100 MCG/2ML IJ SOLN
25.0000 ug | INTRAMUSCULAR | Status: DC | PRN
  Administered 2015-02-17: 25 ug via INTRAVENOUS
  Administered 2015-02-17: 75 ug via INTRAVENOUS

## 2015-02-17 MED ORDER — FENTANYL CITRATE (PF) 100 MCG/2ML IJ SOLN
INTRAMUSCULAR | Status: AC
Start: 2015-02-17 — End: 2015-02-17
  Filled 2015-02-17: qty 2

## 2015-02-17 MED ORDER — ONDANSETRON HCL 4 MG/2ML IJ SOLN: 4.0000 mg | INTRAMUSCULAR | Status: DC | PRN

## 2015-02-17 MED ORDER — GLYCOPYRROLATE 0.2 MG/ML IJ SOLN
INTRAMUSCULAR | Status: AC
  Filled 2015-02-17: qty 3

## 2015-02-17 MED ORDER — MORPHINE SULFATE 1 MG/ML IV SOLN
INTRAVENOUS | Status: AC
  Filled 2015-02-17: qty 25

## 2015-02-17 MED ORDER — DEXAMETHASONE SODIUM PHOSPHATE 10 MG/ML IJ SOLN
INTRAMUSCULAR | Status: DC | PRN
  Administered 2015-02-17: 10 mg via INTRAVENOUS

## 2015-02-17 MED ORDER — ROCURONIUM BROMIDE 100 MG/10ML IV SOLN
INTRAVENOUS | Status: DC | PRN
  Administered 2015-02-17: 40 mg via INTRAVENOUS
  Administered 2015-02-17: 10 mg via INTRAVENOUS

## 2015-02-17 MED ORDER — NEOSTIGMINE METHYLSULFATE 10 MG/10ML IV SOLN
INTRAVENOUS | Status: AC
  Filled 2015-02-17: qty 1

## 2015-02-17 MED ORDER — DEXAMETHASONE SODIUM PHOSPHATE 10 MG/ML IJ SOLN
INTRAMUSCULAR | Status: AC
  Filled 2015-02-17: qty 2

## 2015-02-17 MED ORDER — DEXTROSE IN LACTATED RINGERS 5 % IV SOLN
INTRAVENOUS | Status: DC
  Administered 2015-02-17: 17:00:00 via INTRAVENOUS
  Administered 2015-02-18: 125 mL/h via INTRAVENOUS
  Administered 2015-02-18: 16:00:00 via INTRAVENOUS
  Administered 2015-02-18: 125 mL via INTRAVENOUS
  Administered 2015-02-19: 125 mL/h via INTRAVENOUS
  Administered 2015-02-19: via INTRAVENOUS
  Administered 2015-02-19: 125 mL/h via INTRAVENOUS

## 2015-02-17 MED ORDER — LACTATED RINGERS IV SOLN
INTRAVENOUS | Status: DC
  Administered 2015-02-17: 1000 mL via INTRAVENOUS
  Administered 2015-02-17: 14:00:00 via INTRAVENOUS

## 2015-02-17 MED ORDER — MIDAZOLAM HCL 2 MG/2ML IJ SOLN
INTRAMUSCULAR | Status: AC
  Filled 2015-02-17: qty 2

## 2015-02-17 MED ORDER — NALOXONE HCL 0.4 MG/ML IJ SOLN: 0.4000 mg | INTRAMUSCULAR | Status: DC | PRN

## 2015-02-17 MED ORDER — FENTANYL CITRATE (PF) 100 MCG/2ML IJ SOLN
25.0000 ug | INTRAMUSCULAR | Status: DC | PRN
  Administered 2015-02-17 (×4): 25 ug via INTRAVENOUS
  Administered 2015-02-17: 50 ug via INTRAVENOUS

## 2015-02-17 MED ORDER — ONDANSETRON HCL 4 MG PO TABS: 4.0000 mg | ORAL_TABLET | Freq: Four times a day (QID) | ORAL | Status: DC | PRN

## 2015-02-17 MED ORDER — ACETAMINOPHEN 10 MG/ML IV SOLN
INTRAVENOUS | Status: AC
  Filled 2015-02-17: qty 100

## 2015-02-17 MED ORDER — HYDROMORPHONE HCL 1 MG/ML IJ SOLN
0.2500 mg | INTRAMUSCULAR | Status: DC | PRN
  Administered 2015-02-17 (×2): 0.5 mg via INTRAVENOUS
  Administered 2015-02-17: 0.25 mg via INTRAVENOUS
  Administered 2015-02-17: 0.5 mg via INTRAVENOUS
  Administered 2015-02-17: 0.25 mg via INTRAVENOUS

## 2015-02-17 MED ORDER — PANTOPRAZOLE SODIUM 40 MG IV SOLR: 40.0000 mg | INTRAVENOUS | Status: DC

## 2015-02-17 MED ORDER — LIDOCAINE HCL (PF) 2 % IJ SOLN
INTRAMUSCULAR | Status: DC | PRN
  Administered 2015-02-17: 20 mg via INTRADERMAL

## 2015-02-17 MED ORDER — 0.9 % SODIUM CHLORIDE (POUR BTL) OPTIME
TOPICAL | Status: DC | PRN
  Administered 2015-02-17: 5000 mL

## 2015-02-17 MED ORDER — SUCCINYLCHOLINE CHLORIDE 20 MG/ML IJ SOLN
INTRAMUSCULAR | Status: DC | PRN
  Administered 2015-02-17: 100 mg via INTRAVENOUS

## 2015-02-17 MED ORDER — HYDROMORPHONE HCL 2 MG/ML IJ SOLN
INTRAMUSCULAR | Status: AC
  Filled 2015-02-17: qty 1

## 2015-02-17 SURGICAL SUPPLY — 67 items
APPLIER CLIP 5 13 M/L LIGAMAX5 (MISCELLANEOUS)
APPLIER CLIP ROT 10 11.4 M/L (STAPLE)
BLADE EXTENDED COATED 6.5IN (ELECTRODE) IMPLANT
BLADE HEX COATED 2.75 (ELECTRODE) ×3 IMPLANT
CABLE HIGH FREQUENCY MONO STRZ (ELECTRODE) IMPLANT
CELLS DAT CNTRL 66122 CELL SVR (MISCELLANEOUS) IMPLANT
CLIP APPLIE 5 13 M/L LIGAMAX5 (MISCELLANEOUS) IMPLANT
CLIP APPLIE ROT 10 11.4 M/L (STAPLE) IMPLANT
COUNTER NEEDLE 20 DBL MAG RED (NEEDLE) ×3 IMPLANT
DECANTER SPIKE VIAL GLASS SM (MISCELLANEOUS) IMPLANT
DISSECTOR BLUNT TIP ENDO 5MM (MISCELLANEOUS) IMPLANT
DRAIN CHANNEL 19F RND (DRAIN) IMPLANT
DRAPE LAPAROSCOPIC ABDOMINAL (DRAPES) ×3 IMPLANT
DRAPE UTILITY XL STRL (DRAPES) ×3 IMPLANT
DRSG OPSITE POSTOP 4X10 (GAUZE/BANDAGES/DRESSINGS) IMPLANT
DRSG OPSITE POSTOP 4X6 (GAUZE/BANDAGES/DRESSINGS) IMPLANT
DRSG OPSITE POSTOP 4X8 (GAUZE/BANDAGES/DRESSINGS) IMPLANT
DRSG TELFA 3X8 NADH (GAUZE/BANDAGES/DRESSINGS) ×3 IMPLANT
ELECT REM PT RETURN 9FT ADLT (ELECTROSURGICAL) ×3
ELECTRODE REM PT RTRN 9FT ADLT (ELECTROSURGICAL) ×1 IMPLANT
EVACUATOR SILICONE 100CC (DRAIN) IMPLANT
FILTER SMOKE EVAC LAPAROSHD (FILTER) IMPLANT
GAUZE SPONGE 4X4 12PLY STRL (GAUZE/BANDAGES/DRESSINGS) ×3 IMPLANT
GLOVE ECLIPSE 8.0 STRL XLNG CF (GLOVE) ×6 IMPLANT
GLOVE INDICATOR 8.0 STRL GRN (GLOVE) ×6 IMPLANT
GOWN STRL REUS W/TWL XL LVL3 (GOWN DISPOSABLE) ×18 IMPLANT
LEGGING LITHOTOMY PAIR STRL (DRAPES) IMPLANT
LIGASURE IMPACT 36 18CM CVD LR (INSTRUMENTS) ×3 IMPLANT
MANIFOLD NEPTUNE II (INSTRUMENTS) ×6 IMPLANT
PACK COLON (CUSTOM PROCEDURE TRAY) ×3 IMPLANT
RTRCTR WOUND ALEXIS 18CM MED (MISCELLANEOUS)
SCISSORS LAP 5X35 DISP (ENDOMECHANICALS) ×3 IMPLANT
SET IRRIG TUBING LAPAROSCOPIC (IRRIGATION / IRRIGATOR) ×3 IMPLANT
SHEARS HARMONIC ACE PLUS 36CM (ENDOMECHANICALS) IMPLANT
SHEARS HARMONIC ACE PLUS 45CM (MISCELLANEOUS) IMPLANT
SLEEVE XCEL OPT CAN 5 100 (ENDOMECHANICALS) ×6 IMPLANT
SOLUTION ANTI FOG 6CC (MISCELLANEOUS) IMPLANT
SPONGE LAP 18X18 X RAY DECT (DISPOSABLE) ×3 IMPLANT
STAPLER CUT CVD 40MM BLUE (STAPLE) ×3 IMPLANT
STAPLER PROXIMATE 75MM BLUE (STAPLE) ×3 IMPLANT
STAPLER VISISTAT 35W (STAPLE) ×3 IMPLANT
SUCTION POOLE TIP (SUCTIONS) IMPLANT
SUT ETHILON 3 0 PS 1 (SUTURE) IMPLANT
SUT PDS AB 1 CTX 36 (SUTURE) IMPLANT
SUT PDS AB 1 TP1 96 (SUTURE) ×6 IMPLANT
SUT PROLENE 2 0 KS (SUTURE) IMPLANT
SUT PROLENE 2 0 SH DA (SUTURE) ×3 IMPLANT
SUT SILK 2 0 (SUTURE) ×2
SUT SILK 2 0 SH CR/8 (SUTURE) ×3 IMPLANT
SUT SILK 2-0 18XBRD TIE 12 (SUTURE) ×1 IMPLANT
SUT SILK 3 0 (SUTURE) ×2
SUT SILK 3 0 SH CR/8 (SUTURE) ×3 IMPLANT
SUT SILK 3-0 18XBRD TIE 12 (SUTURE) ×1 IMPLANT
SUT VIC AB 2-0 SH 18 (SUTURE) ×3 IMPLANT
SUT VIC AB 3-0 SH 18 (SUTURE) ×6 IMPLANT
SUT VICRYL 2 0 18  UND BR (SUTURE) ×2
SUT VICRYL 2 0 18 UND BR (SUTURE) ×1 IMPLANT
SYS LAPSCP GELPORT 120MM (MISCELLANEOUS) ×3
SYSTEM LAPSCP GELPORT 120MM (MISCELLANEOUS) ×1 IMPLANT
TOWEL OR 17X26 10 PK STRL BLUE (TOWEL DISPOSABLE) IMPLANT
TOWEL OR NON WOVEN STRL DISP B (DISPOSABLE) ×6 IMPLANT
TRAY FOLEY CATH 14FRSI W/METER (CATHETERS) ×3 IMPLANT
TRAY FOLEY W/METER SILVER 14FR (SET/KITS/TRAYS/PACK) ×3 IMPLANT
TROCAR BLADELESS OPT 5 100 (ENDOMECHANICALS) ×3 IMPLANT
TROCAR XCEL BLUNT TIP 100MML (ENDOMECHANICALS) ×3 IMPLANT
TROCAR XCEL NON-BLD 11X100MML (ENDOMECHANICALS) IMPLANT
TUBING INSUFFLATION 10FT LAP (TUBING) ×3 IMPLANT

## 2015-02-17 NOTE — Progress Notes (Signed)
Subjective: Pain no better.  Objective: Vital signs in last 24 hours: Temp:  [98.4 F (36.9 C)-98.5 F (36.9 C)] 98.5 F (36.9 C) (06/23 0518) Pulse Rate:  [57-58] 58 (06/23 0518) Resp:  [18] 18 (06/23 0518) BP: (104-112)/(51-60) 112/60 mmHg (06/23 0518) SpO2:  [96 %-98 %] 96 % (06/23 0518) Last BM Date: 02/17/15  Intake/Output from previous day: 06/22 0701 - 06/23 0700 In: 300 [P.O.:300] Out: 500 [Urine:500] Intake/Output this shift:    PE: General- In NAD Abdomen-soft, lower abdominal tenderness is unchanged, colostomy site marking in LLQ  Lab Results:   Recent Labs  02/16/15 0453 02/17/15 0435  WBC 6.8 7.0  HGB 11.2* 11.0*  HCT 34.8* 33.2*  PLT 208 205   BMET  Recent Labs  02/16/15 0453 02/17/15 0435  NA 136 140  K 4.1 4.0  CL 103 105  CO2 30 29  GLUCOSE 111* 122*  BUN <5* <5*  CREATININE 0.75 0.79  CALCIUM 8.4* 8.3*   PT/INR No results for input(s): LABPROT, INR in the last 72 hours. Comprehensive Metabolic Panel:    Component Value Date/Time   NA 140 02/17/2015 0435   NA 136 02/16/2015 0453   K 4.0 02/17/2015 0435   K 4.1 02/16/2015 0453   CL 105 02/17/2015 0435   CL 103 02/16/2015 0453   CO2 29 02/17/2015 0435   CO2 30 02/16/2015 0453   BUN <5* 02/17/2015 0435   BUN <5* 02/16/2015 0453   CREATININE 0.79 02/17/2015 0435   CREATININE 0.75 02/16/2015 0453   GLUCOSE 122* 02/17/2015 0435   GLUCOSE 111* 02/16/2015 0453   CALCIUM 8.3* 02/17/2015 0435   CALCIUM 8.4* 02/16/2015 0453     Studies/Results: Ct Abdomen Pelvis W Contrast  02/16/2015   CLINICAL DATA:  Followup diverticulitis, lower abdominal pain  EXAM: CT ABDOMEN AND PELVIS WITH CONTRAST  TECHNIQUE: Multidetector CT imaging of the abdomen and pelvis was performed using the standard protocol following bolus administration of intravenous contrast.  CONTRAST:  OMNIPAQUE IOHEXOL 300 MG/ML  SOLN  COMPARISON:  02/12/2015  FINDINGS: Lung bases again demonstrate mild right  basilar atelectasis. The recently seen right middle lobe nodule is not covered on this exam.  The liver, spleen, adrenal glands and pancreas are within normal limits. The gallbladder has been surgically removed. The kidneys demonstrate cystic change similar to that noted on the prior exam. No renal calculi are noted. There remains fullness of the left renal collecting system and left ureter.  Changes of diverticulitis are again identified. Some extra luminal air is again seen. The amount of extra luminal air appears slightly greater than that seen on the prior exam although no definitive abscess is identified. The bladder is well distended. Left ovarian cyst is again seen and stable.  IMPRESSION: Changes of diverticulitis are again identified. The degree of extra luminal air surrounding the sigmoid colon has increased slightly in the interval from the prior exam. No abscess is identified. Stable free fluid is noted anterior to the rectum within the pelvis.  No other focal abnormality is seen. Continued followup is recommended.   Electronically Signed   By: Alcide Clever M.D.   On: 02/16/2015 13:17    Anti-infectives: Anti-infectives    Start     Dose/Rate Route Frequency Ordered Stop   02/17/15 1230  cefoTEtan (CEFOTAN) 2 g in dextrose 5 % 50 mL IVPB     2 g 100 mL/hr over 30 Minutes Intravenous On call to O.R. 02/16/15 1526 02/18/15 0559  02/14/15 1500  ertapenem (INVANZ) 1 g in sodium chloride 0.9 % 50 mL IVPB     1 g 100 mL/hr over 30 Minutes Intravenous Every 24 hours 02/14/15 1405     02/12/15 1700  piperacillin-tazobactam (ZOSYN) IVPB 3.375 g  Status:  Discontinued     3.375 g 12.5 mL/hr over 240 Minutes Intravenous Every 8 hours 02/12/15 1602 02/14/15 1405   02/12/15 1600  metroNIDAZOLE (FLAGYL) IVPB 500 mg  Status:  Discontinued     500 mg 100 mL/hr over 60 Minutes Intravenous Every 8 hours 02/12/15 1559 02/14/15 1405   02/11/15 1500  cefoTEtan (CEFOTAN) 2 g in dextrose 5 % 50 mL IVPB   Status:  Discontinued     2 g 100 mL/hr over 30 Minutes Intravenous Every 12 hours 02/11/15 1355 02/12/15 1559   02/11/15 1400  ampicillin (OMNIPEN) 2 g in sodium chloride 0.9 % 50 mL IVPB  Status:  Discontinued     2 g 150 mL/hr over 20 Minutes Intravenous 4 times per day 02/11/15 1349 02/11/15 1354   02/11/15 1400  clindamycin (CLEOCIN) IVPB 900 mg  Status:  Discontinued     900 mg 100 mL/hr over 30 Minutes Intravenous 3 times per day 02/11/15 1349 02/11/15 1354   02/11/15 1400  doxycycline (VIBRAMYCIN) 100 mg in dextrose 5 % 250 mL IVPB  Status:  Discontinued     100 mg 125 mL/hr over 120 Minutes Intravenous Every 12 hours 02/11/15 1355 02/12/15 1559      Assessment Principal Problem:   Sigmoid diverticulitis with microperforation-no clinical improvement and slightly worsening of process on CT despite IV abxs and bowel rest.     LOS: 6 days   Plan: Partial colectomy and colostomy.  I have explained the procedure and risks of colon resection.  Risks include but are not limited to bleeding, infection, wound problems, anesthesia, need for colostomy care, need for reoperative surgery,  injury to intraabominal organs (such as intestine, spleen, kidney, bladder, ureter, etc.), ileus.  She seems to understand and agrees to proceed.   Carla Herrera Carla Herrera 02/17/2015

## 2015-02-17 NOTE — Anesthesia Preprocedure Evaluation (Addendum)
Anesthesia Evaluation  Patient identified by MRN, date of birth, ID band Patient awake    Reviewed: Allergy & Precautions, NPO status , Patient's Chart, lab work & pertinent test results  Airway Mallampati: II  TM Distance: >3 FB Neck ROM: Full    Dental no notable dental hx.    Pulmonary Current Smoker,  breath sounds clear to auscultation  Pulmonary exam normal       Cardiovascular negative cardio ROS Normal cardiovascular examRhythm:Regular Rate:Normal     Neuro/Psych negative neurological ROS  negative psych ROS   GI/Hepatic negative GI ROS, Neg liver ROS,   Endo/Other  negative endocrine ROS  Renal/GU negative Renal ROS  negative genitourinary   Musculoskeletal negative musculoskeletal ROS (+)   Abdominal   Peds negative pediatric ROS (+)  Hematology negative hematology ROS (+)   Anesthesia Other Findings   Reproductive/Obstetrics negative OB ROS                            Anesthesia Physical Anesthesia Plan  ASA: II  Anesthesia Plan: General   Post-op Pain Management:    Induction: Intravenous  Airway Management Planned: Oral ETT  Additional Equipment:   Intra-op Plan:   Post-operative Plan: Extubation in OR  Informed Consent: I have reviewed the patients History and Physical, chart, labs and discussed the procedure including the risks, benefits and alternatives for the proposed anesthesia with the patient or authorized representative who has indicated his/her understanding and acceptance.   Dental advisory given  Plan Discussed with: CRNA  Anesthesia Plan Comments:         Anesthesia Quick Evaluation  

## 2015-02-17 NOTE — Anesthesia Postprocedure Evaluation (Signed)
  Anesthesia Post-op Note  Patient: Carla Herrera  Procedure(s) Performed: Procedure(s): LAPAROSCOPIC ASSISTED PARTIAL COLECTOMY, COLOSTOMY (N/A)  Patient Location: PACU  Anesthesia Type:General  Level of Consciousness: awake and alert   Airway and Oxygen Therapy: Patient Spontanous Breathing  Post-op Pain: moderate  Post-op Assessment: Post-op Vital signs reviewed, Patient's Cardiovascular Status Stable, Respiratory Function Stable, Patent Airway and No signs of Nausea or vomiting              Post-op Vital Signs: Reviewed and stable  Last Vitals:  Filed Vitals:   02/17/15 1600  BP:   Pulse:   Temp:   Resp: 12    Complications: No apparent anesthesia complications

## 2015-02-17 NOTE — Transfer of Care (Signed)
Immediate Anesthesia Transfer of Care Note  Patient: Carla Herrera  Procedure(s) Performed: Procedure(s): LAPAROSCOPIC ASSISTED PARTIAL COLECTOMY, COLOSTOMY (N/A)  Patient Location: PACU  Anesthesia Type:General  Level of Consciousness:  sedated, patient cooperative and responds to stimulation  Airway & Oxygen Therapy:Patient Spontanous Breathing and Patient connected to face mask oxgen  Post-op Assessment:  Report given to PACU RN and Post -op Vital signs reviewed and stable  Post vital signs:  Reviewed and stable  Last Vitals:  Filed Vitals:   02/17/15 0518  BP: 112/60  Pulse: 58  Temp: 36.9 C  Resp: 18    Complications: No apparent anesthesia complications

## 2015-02-17 NOTE — Op Note (Signed)
Operative Note  Carla Herrera female 49 y.o. 02/17/2015  PREOPERATIVE DX:  Acute, persistent sigmoid diverticulitis  POSTOPERATIVE DX:  Same  PROCEDURE:   Laparoscopic assisted sigmoid colectomy with colostomy         Surgeon: Adolph Pollack   Assistants: None  Anesthesia: General endotracheal anesthesia  Indications:   This is a 49 year old female admitted 1 week ago but was felt to be pelvic inflammatory disease and started on IV antibiotics. CT scan demonstrated findings consistent with acute sigmoid diverticulitis with microperforation. She is placed on broad-spectrum antibiotics. She progressed very slowly at first but it has now failed to progress any further and a CT scans demonstrating slight worsening of the diverticulitis with no abscess. She is now brought to the operating room for the above procedure.    Procedure Detail:  She was brought to the operating room and placed supine on the operating table and a general anesthetic was given. A Foley catheter was inserted. An oral gastric tube was inserted. The abdominal wall was widely sterilely prepped and draped.  Marcaine was infiltrated just above the umbilicus in the skin and subcutaneous tissue. All small incision was made just above the umbilicus and the subcutaneous tissue was dissected bluntly. A small incision was made in the midline fascia and the peritoneal cavity was entered under direct vision. A pursestring suture of 0 Vicryls placed around the edges of the fascia. A Hassan trocar is introduced into the peritoneal cavity and a pneumoperitoneum created by insufflation of CO2 gas.  The laparoscope was introduced and there is no underlying organ injury or bleeding. I inspected the upper quadrants and there is no fluid or inflammatory change. The right lower quadrant without inflammatory change. There is significant inflammatory changes and loop of sigmoid colon in the left lower quadrant. A 5 mm trochars placed in the  right lower quadrant and one in the lower midline. I was able to separate the inflamed sigmoid colon somewhat from a piece of small intestine but not completely. I also separated from the posterior aspect of the uterus. I mobilized the descending colon by dividing its lateral attachments so it could be pulled down to the site of the planned colostomy in the left lower quadrant abdominal wall. I was able to mobilize any more of the inflamed colon laparoscopically. A limited midline incision was made at the site of the lower midline 5 mm trocar in the peritoneal cavity entered. A wound protection device was placed. Under direct vision I was able to separate inflammatory adhesions between the pelvic sidewall where there were quite dense and obliterating normal planes.  I then divided the descending colon where it was normal at the distal aspect with the linear cutting stapler. Staying close to the sigmoid colon I carefully divided the mesentery and ligated some of the bleeding inflamed vessels with silk sutures. I continued to divide the mesentery close to this inflamed sigmoid colon and mobilized its pelvic attachments. I was able to separate the area where the small bowel was adherent to the sigmoid colon bluntly and there is no evidence of enterotomy. I reached the rectosigmoid junction. The rectum was soft. I divided the proximal rectum with the linear cutting stapler. The rectal stump was marked with two 2-0 Prolene sutures. The inflamed, firm sigmoid colon was handed off the field. There is no evidence of pelvic abscess.  A circular incision was then made in the skin and subcutaneous tissue in the left lower quadrant where the colostomy site had  been marked. A cruciate incision was then made in the anterior and posterior rectus sheath facet. The descending colon stump was then brought up through the fascial defect. It was anchored to the anterior fascia with interrupted 2-0 Vicryl sutures.  The abdominal  cavity was then copiously irrigated with saline. There is no evidence of organ injury or bleeding. The midline fascia of the incision was then closed with running double-stranded PDS suture. The subcutaneous tissue was left open. Laparoscopy was performed and the fascial closure was solid. I then removed the [facility] trocar and closed the fascial defect above the umbilicus by tightening up and tied down the pursestring suture. The 5 mm trocar in the right lower quadrant was removed.  The colostomy was matured with interrupted 3-0 Vicryl sutures and an appliance placed over it.  The trocar site skin incisions were closed with staples. The lower midline wound was packed with saline moistened gauze. Dry dressings were applied.  She tolerated the procedure without any apparent complications and was taken to the recovery room in satisfactory condition.   Estimated Blood Loss:  300 mL                Specimens: sigmoid colon        Complications:  * No complications entered in OR log *         Disposition: PACU - hemodynamically stable.         Condition: stable

## 2015-02-17 NOTE — Anesthesia Procedure Notes (Signed)
Procedure Name: Intubation Date/Time: 02/17/2015 1:23 PM Performed by: Early Osmond E Pre-anesthesia Checklist: Patient identified, Emergency Drugs available, Suction available and Patient being monitored Patient Re-evaluated:Patient Re-evaluated prior to inductionOxygen Delivery Method: Circle System Utilized Preoxygenation: Pre-oxygenation with 100% oxygen Intubation Type: IV induction Ventilation: Mask ventilation without difficulty Laryngoscope Size: Miller and 2 Grade View: Grade II Tube type: Oral Tube size: 7.0 mm Number of attempts: 1 Airway Equipment and Method: Bougie stylet Placement Confirmation: ETT inserted through vocal cords under direct vision,  positive ETCO2 and breath sounds checked- equal and bilateral Secured at: 20 cm Tube secured with: Tape Dental Injury: Teeth and Oropharynx as per pre-operative assessment

## 2015-02-18 LAB — BASIC METABOLIC PANEL
Anion gap: 7 (ref 5–15)
BUN: <5 mg/dL — ABNORMAL LOW (ref 6–20)
CALCIUM: 8.5 mg/dL — AB (ref 8.9–10.3)
CO2: 29 mmol/L (ref 22–32)
CREATININE: 0.83 mg/dL (ref 0.44–1.00)
Chloride: 102 mmol/L (ref 101–111)
GFR calc non Af Amer: >60 mL/min (ref >60)
Glucose, Bld: 126 mg/dL — ABNORMAL HIGH (ref 65–99)
Potassium: 4.2 mmol/L (ref 3.5–5.1)
Sodium: 138 mmol/L (ref 135–145)

## 2015-02-18 LAB — CBC
HCT: 34.3 % — ABNORMAL LOW (ref 36.0–46.0)
Hemoglobin: 11.3 g/dL — ABNORMAL LOW (ref 12.0–15.0)
MCH: 31.2 pg (ref 26.0–34.0)
MCHC: 32.9 g/dL (ref 30.0–36.0)
MCV: 94.8 fL (ref 78.0–100.0)
PLATELETS: 263 10*3/uL (ref 150–400)
RBC: 3.62 MIL/uL — AB (ref 3.87–5.11)
RDW: 13.3 % (ref 11.5–15.5)
WBC: 20.8 10*3/uL — AB (ref 4.0–10.5)

## 2015-02-18 MED ORDER — CETYLPYRIDINIUM CHLORIDE 0.05 % MT LIQD
7.0000 mL | Freq: Two times a day (BID) | OROMUCOSAL | Status: DC
  Administered 2015-02-18 – 2015-02-19 (×4): 7 mL via OROMUCOSAL

## 2015-02-18 NOTE — Progress Notes (Signed)
1 Day Post-Op  Subjective: Very sore.  We discussed operation  Objective: Vital signs in last 24 hours: Temp:  [97.9 F (36.6 C)-99 F (37.2 C)] 97.9 F (36.6 C) (06/24 0500) Pulse Rate:  [55-71] 55 (06/24 0500) Resp:  [11-19] 14 (06/24 0500) BP: (90-128)/(51-69) 90/51 mmHg (06/24 0500) SpO2:  [94 %-100 %] 95 % (06/24 0500) Last BM Date: 02/17/15  Intake/Output from previous day: 06/23 0701 - 06/24 0700 In: 3670.8 [I.V.:3670.8] Out: 2250 [Urine:1950; Blood:300] Intake/Output this shift:    PE: General- In NAD Abdomen-soft, dressing dry, stoma edematous and viable with no output  Lab Results:   Recent Labs  02/17/15 0435 02/18/15 0409  WBC 7.0 20.8*  HGB 11.0* 11.3*  HCT 33.2* 34.3*  PLT 205 263   BMET  Recent Labs  02/17/15 0435 02/18/15 0409  NA 140 138  K 4.0 4.2  CL 105 102  CO2 29 29  GLUCOSE 122* 126*  BUN <5* <5*  CREATININE 0.79 0.83  CALCIUM 8.3* 8.5*   PT/INR No results for input(s): LABPROT, INR in the last 72 hours. Comprehensive Metabolic Panel:    Component Value Date/Time   NA 138 02/18/2015 0409   NA 140 02/17/2015 0435   K 4.2 02/18/2015 0409   K 4.0 02/17/2015 0435   CL 102 02/18/2015 0409   CL 105 02/17/2015 0435   CO2 29 02/18/2015 0409   CO2 29 02/17/2015 0435   BUN <5* 02/18/2015 0409   BUN <5* 02/17/2015 0435   CREATININE 0.83 02/18/2015 0409   CREATININE 0.79 02/17/2015 0435   GLUCOSE 126* 02/18/2015 0409   GLUCOSE 122* 02/17/2015 0435   CALCIUM 8.5* 02/18/2015 0409   CALCIUM 8.3* 02/17/2015 0435     Studies/Results: Ct Abdomen Pelvis W Contrast  02/16/2015   CLINICAL DATA:  Followup diverticulitis, lower abdominal pain  EXAM: CT ABDOMEN AND PELVIS WITH CONTRAST  TECHNIQUE: Multidetector CT imaging of the abdomen and pelvis was performed using the standard protocol following bolus administration of intravenous contrast.  CONTRAST:  OMNIPAQUE IOHEXOL 300 MG/ML  SOLN  COMPARISON:  02/12/2015  FINDINGS: Lung  bases again demonstrate mild right basilar atelectasis. The recently seen right middle lobe nodule is not covered on this exam.  The liver, spleen, adrenal glands and pancreas are within normal limits. The gallbladder has been surgically removed. The kidneys demonstrate cystic change similar to that noted on the prior exam. No renal calculi are noted. There remains fullness of the left renal collecting system and left ureter.  Changes of diverticulitis are again identified. Some extra luminal air is again seen. The amount of extra luminal air appears slightly greater than that seen on the prior exam although no definitive abscess is identified. The bladder is well distended. Left ovarian cyst is again seen and stable.  IMPRESSION: Changes of diverticulitis are again identified. The degree of extra luminal air surrounding the sigmoid colon has increased slightly in the interval from the prior exam. No abscess is identified. Stable free fluid is noted anterior to the rectum within the pelvis.  No other focal abnormality is seen. Continued followup is recommended.   Electronically Signed   By: Alcide Clever M.D.   On: 02/16/2015 13:17    Anti-infectives: Anti-infectives    Start     Dose/Rate Route Frequency Ordered Stop   02/17/15 1230  cefoTEtan (CEFOTAN) 2 g in dextrose 5 % 50 mL IVPB  Status:  Discontinued     2 g 100 mL/hr over 30 Minutes  Intravenous On call to O.R. 02/16/15 1526 02/17/15 1003   02/14/15 1500  ertapenem (INVANZ) 1 g in sodium chloride 0.9 % 50 mL IVPB     1 g 100 mL/hr over 30 Minutes Intravenous Every 24 hours 02/14/15 1405     02/12/15 1700  piperacillin-tazobactam (ZOSYN) IVPB 3.375 g  Status:  Discontinued     3.375 g 12.5 mL/hr over 240 Minutes Intravenous Every 8 hours 02/12/15 1602 02/14/15 1405   02/12/15 1600  metroNIDAZOLE (FLAGYL) IVPB 500 mg  Status:  Discontinued     500 mg 100 mL/hr over 60 Minutes Intravenous Every 8 hours 02/12/15 1559 02/14/15 1405   02/11/15 1500   cefoTEtan (CEFOTAN) 2 g in dextrose 5 % 50 mL IVPB  Status:  Discontinued     2 g 100 mL/hr over 30 Minutes Intravenous Every 12 hours 02/11/15 1355 02/12/15 1559   02/11/15 1400  ampicillin (OMNIPEN) 2 g in sodium chloride 0.9 % 50 mL IVPB  Status:  Discontinued     2 g 150 mL/hr over 20 Minutes Intravenous 4 times per day 02/11/15 1349 02/11/15 1354   02/11/15 1400  clindamycin (CLEOCIN) IVPB 900 mg  Status:  Discontinued     900 mg 100 mL/hr over 30 Minutes Intravenous 3 times per day 02/11/15 1349 02/11/15 1354   02/11/15 1400  doxycycline (VIBRAMYCIN) 100 mg in dextrose 5 % 250 mL IVPB  Status:  Discontinued     100 mg 125 mL/hr over 120 Minutes Intravenous Every 12 hours 02/11/15 1355 02/12/15 1559      Assessment Principal Problem:   Sigmoid diverticulitis with microperforation s/p Hartmann procedure 02/17/15 (Carla Herrera)-stable overnight     LOS: 7 days   Plan: Continue IV abxs. OOB.  Hold on diet.  Start dressing changes tomorrow.   Derron Pipkins [name] 02/18/2015

## 2015-02-18 NOTE — Consult Note (Signed)
WOC ostomy consult note Stoma type/location: LLQ Colostomy Stomal assessment/size: 1 and 3/8 inches round, slightly higher at lateral edge than medial edge.  Medial edge is nearly flush. Mild depression around stoma (gulley) Peristomal assessment: intact, clear Treatment options for stomal/peristomal skin: skin barrier ring to try and compensate for gulley and flush medial edge of stoma Output Serosanguinous Ostomy pouching: 1pc.convex pouch with skin barrier ring Education provided: Patient given basic instruction on GI A&P as well as stoma and pouch characteristics.  While interested and engaged, she used her PCA pump twice during our encounter and her eyes keep closing as I speak.  Enrolled patient in [employer] DC program: No WOC nursing team will follow, and will remain available to this patient, the nursing, surgical and medical teams.   Thanks, Ladona Mow, MSN, RN, GNP, [place], CWON-AP [phone])

## 2015-02-19 NOTE — Progress Notes (Signed)
2 Days Post-Op  Subjective: Soreness is better.  Walked yesterday.  Objective: Vital signs in last 24 hours: Temp:  [98.1 F (36.7 C)-98.5 F (36.9 C)] 98.5 F (36.9 C) (06/25 0600) Pulse Rate:  [57-78] 67 (06/25 0600) Resp:  [11-16] 14 (06/25 0600) BP: (93-109)/(37-53) 100/47 mmHg (06/25 0600) SpO2:  [93 %-98 %] 96 % (06/25 0600) Last BM Date: 02/17/15  Intake/Output from previous day: 06/24 0701 - 06/25 0700 In: 1657.1 [P.O.:180; I.V.:1477.1] Out: 880 [Urine:875; Stool:5] Intake/Output this shift:    PE: General- In NAD Abdomen-soft, open lower midline wound is clean, stoma edematous and viable with no output  Lab Results:   Recent Labs  02/17/15 0435 02/18/15 0409  WBC 7.0 20.8*  HGB 11.0* 11.3*  HCT 33.2* 34.3*  PLT 205 263   BMET  Recent Labs  02/17/15 0435 02/18/15 0409  NA 140 138  K 4.0 4.2  CL 105 102  CO2 29 29  GLUCOSE 122* 126*  BUN <5* <5*  CREATININE 0.79 0.83  CALCIUM 8.3* 8.5*   PT/INR No results for input(s): LABPROT, INR in the last 72 hours. Comprehensive Metabolic Panel:    Component Value Date/Time   NA 138 02/18/2015 0409   NA 140 02/17/2015 0435   K 4.2 02/18/2015 0409   K 4.0 02/17/2015 0435   CL 102 02/18/2015 0409   CL 105 02/17/2015 0435   CO2 29 02/18/2015 0409   CO2 29 02/17/2015 0435   BUN <5* 02/18/2015 0409   BUN <5* 02/17/2015 0435   CREATININE 0.83 02/18/2015 0409   CREATININE 0.79 02/17/2015 0435   GLUCOSE 126* 02/18/2015 0409   GLUCOSE 122* 02/17/2015 0435   CALCIUM 8.5* 02/18/2015 0409   CALCIUM 8.3* 02/17/2015 0435     Studies/Results: No results found.  Anti-infectives: Anti-infectives    Start     Dose/Rate Route Frequency Ordered Stop   02/17/15 1230  cefoTEtan (CEFOTAN) 2 g in dextrose 5 % 50 mL IVPB  Status:  Discontinued     2 g 100 mL/hr over 30 Minutes Intravenous On call to O.R. 02/16/15 1526 02/17/15 1003   02/14/15 1500  ertapenem (INVANZ) 1 g in sodium chloride 0.9 % 50 mL IVPB     1 g 100 mL/hr over 30 Minutes Intravenous Every 24 hours 02/14/15 1405     02/12/15 1700  piperacillin-tazobactam (ZOSYN) IVPB 3.375 g  Status:  Discontinued     3.375 g 12.5 mL/hr over 240 Minutes Intravenous Every 8 hours 02/12/15 1602 02/14/15 1405   02/12/15 1600  metroNIDAZOLE (FLAGYL) IVPB 500 mg  Status:  Discontinued     500 mg 100 mL/hr over 60 Minutes Intravenous Every 8 hours 02/12/15 1559 02/14/15 1405   02/11/15 1500  cefoTEtan (CEFOTAN) 2 g in dextrose 5 % 50 mL IVPB  Status:  Discontinued     2 g 100 mL/hr over 30 Minutes Intravenous Every 12 hours 02/11/15 1355 02/12/15 1559   02/11/15 1400  ampicillin (OMNIPEN) 2 g in sodium chloride 0.9 % 50 mL IVPB  Status:  Discontinued     2 g 150 mL/hr over 20 Minutes Intravenous 4 times per day 02/11/15 1349 02/11/15 1354   02/11/15 1400  clindamycin (CLEOCIN) IVPB 900 mg  Status:  Discontinued     900 mg 100 mL/hr over 30 Minutes Intravenous 3 times per day 02/11/15 1349 02/11/15 1354   02/11/15 1400  doxycycline (VIBRAMYCIN) 100 mg in dextrose 5 % 250 mL IVPB  Status:  Discontinued  100 mg 125 mL/hr over 120 Minutes Intravenous Every 12 hours 02/11/15 1355 02/12/15 1559      Assessment Principal Problem:   Sigmoid diverticulitis with microperforation s/p Hartmann procedure 02/17/15 (Joseth Weigel)-wound looks good; has walked some.     LOS: 8 days   Plan: Continue IV abxs.  Start clear liquids.  Start NS wet to dry dressing changes BID.  Check lab tomorrow.   Keashia Haskins [name] 02/19/2015

## 2015-02-20 LAB — BASIC METABOLIC PANEL
ANION GAP: 6 (ref 5–15)
BUN: <5 mg/dL — ABNORMAL LOW (ref 6–20)
CALCIUM: 8 mg/dL — AB (ref 8.9–10.3)
CHLORIDE: 98 mmol/L — AB (ref 101–111)
CO2: 33 mmol/L — ABNORMAL HIGH (ref 22–32)
CREATININE: 0.71 mg/dL (ref 0.44–1.00)
GFR calc Af Amer: >60 mL/min (ref >60)
GLUCOSE: 100 mg/dL — AB (ref 65–99)
POTASSIUM: 3.2 mmol/L — AB (ref 3.5–5.1)
SODIUM: 137 mmol/L (ref 135–145)

## 2015-02-20 LAB — CBC
HCT: 32.3 % — ABNORMAL LOW (ref 36.0–46.0)
Hemoglobin: 10.3 g/dL — ABNORMAL LOW (ref 12.0–15.0)
MCH: 30.3 pg (ref 26.0–34.0)
MCHC: 31.9 g/dL (ref 30.0–36.0)
MCV: 95 fL (ref 78.0–100.0)
Platelets: 227 10*3/uL (ref 150–400)
RBC: 3.4 MIL/uL — ABNORMAL LOW (ref 3.87–5.11)
RDW: 13.6 % (ref 11.5–15.5)
WBC: 8.4 10*3/uL (ref 4.0–10.5)

## 2015-02-20 MED ORDER — KCL IN DEXTROSE-NACL 40-5-0.9 MEQ/L-%-% IV SOLN
INTRAVENOUS | Status: DC
  Administered 2015-02-20: 22:00:00 via INTRAVENOUS
  Administered 2015-02-20: 125 mL/h via INTRAVENOUS
  Administered 2015-02-20 – 2015-02-21 (×2): via INTRAVENOUS
  Administered 2015-02-21: 125 mL/h via INTRAVENOUS
  Administered 2015-02-22: 09:00:00 via INTRAVENOUS
  Filled 2015-02-20 (×9): qty 1000

## 2015-02-20 NOTE — Progress Notes (Signed)
3 Days Post-Op  Subjective: No complaints Tolerated clears  Objective: Vital signs in last 24 hours: Temp:  [98.5 F (36.9 C)-99.3 F (37.4 C)] 98.5 F (36.9 C) (06/26 0600) Pulse Rate:  [59-69] 59 (06/26 0600) Resp:  [14-20] 16 (06/26 0600) BP: (90-113)/(39-53) 103/39 mmHg (06/26 0600) SpO2:  [2 %-98 %] 97 % (06/26 0600) Last BM Date: 02/17/15  Intake/Output from previous day: 06/25 0701 - 06/26 0700 In: 3502.9 [P.O.:480; I.V.:3022.9] Out: 2050 [Urine:2050] Intake/Output this shift:    Abdomen soft, ostomy pink, no gas in the bag  Lab Results:   Recent Labs  02/18/15 0409 02/20/15 0455  WBC 20.8* 8.4  HGB 11.3* 10.3*  HCT 34.3* 32.3*  PLT 263 227   BMET  Recent Labs  02/18/15 0409 02/20/15 0455  NA 138 137  K 4.2 3.2*  CL 102 98*  CO2 29 33*  GLUCOSE 126* 100*  BUN <5* <5*  CREATININE 0.83 0.71  CALCIUM 8.5* 8.0*   PT/INR No results for input(s): LABPROT, INR in the last 72 hours. ABG No results for input(s): PHART, HCO3 in the last 72 hours.  Invalid input(s): PCO2, PO2  Studies/Results: No results found.  Anti-infectives: Anti-infectives    Start     Dose/Rate Route Frequency Ordered Stop   02/17/15 1230  cefoTEtan (CEFOTAN) 2 g in dextrose 5 % 50 mL IVPB  Status:  Discontinued     2 g 100 mL/hr over 30 Minutes Intravenous On call to O.R. 02/16/15 1526 02/17/15 1003   02/14/15 1500  ertapenem (INVANZ) 1 g in sodium chloride 0.9 % 50 mL IVPB     1 g 100 mL/hr over 30 Minutes Intravenous Every 24 hours 02/14/15 1405     02/12/15 1700  piperacillin-tazobactam (ZOSYN) IVPB 3.375 g  Status:  Discontinued     3.375 g 12.5 mL/hr over 240 Minutes Intravenous Every 8 hours 02/12/15 1602 02/14/15 1405   02/12/15 1600  metroNIDAZOLE (FLAGYL) IVPB 500 mg  Status:  Discontinued     500 mg 100 mL/hr over 60 Minutes Intravenous Every 8 hours 02/12/15 1559 02/14/15 1405   02/11/15 1500  cefoTEtan (CEFOTAN) 2 g in dextrose 5 % 50 mL IVPB  Status:   Discontinued     2 g 100 mL/hr over 30 Minutes Intravenous Every 12 hours 02/11/15 1355 02/12/15 1559   02/11/15 1400  ampicillin (OMNIPEN) 2 g in sodium chloride 0.9 % 50 mL IVPB  Status:  Discontinued     2 g 150 mL/hr over 20 Minutes Intravenous 4 times per day 02/11/15 1349 02/11/15 1354   02/11/15 1400  clindamycin (CLEOCIN) IVPB 900 mg  Status:  Discontinued     900 mg 100 mL/hr over 30 Minutes Intravenous 3 times per day 02/11/15 1349 02/11/15 1354   02/11/15 1400  doxycycline (VIBRAMYCIN) 100 mg in dextrose 5 % 250 mL IVPB  Status:  Discontinued     100 mg 125 mL/hr over 120 Minutes Intravenous Every 12 hours 02/11/15 1355 02/12/15 1559      Assessment/Plan: s/p Procedure(s): LAPAROSCOPIC ASSISTED PARTIAL COLECTOMY, COLOSTOMY (N/A)  Keep on clears Replace K+ by changing IVF Wound care Continue antibiotics  LOS: 9 days    Carla Herrera A 02/20/2015

## 2015-02-21 ENCOUNTER — Encounter (HOSPITAL_COMMUNITY): Payer: Self-pay | Admitting: General Surgery

## 2015-02-21 LAB — BASIC METABOLIC PANEL
Anion gap: 7 (ref 5–15)
BUN: 12 mg/dL (ref 6–20)
CHLORIDE: 98 mmol/L — AB (ref 101–111)
CO2: 26 mmol/L (ref 22–32)
CREATININE: 0.61 mg/dL (ref 0.44–1.00)
Calcium: 8.3 mg/dL — ABNORMAL LOW (ref 8.9–10.3)
GFR calc non Af Amer: >60 mL/min (ref >60)
Glucose, Bld: 119 mg/dL — ABNORMAL HIGH (ref 65–99)
Potassium: 3.3 mmol/L — ABNORMAL LOW (ref 3.5–5.1)
Sodium: 131 mmol/L — ABNORMAL LOW (ref 135–145)

## 2015-02-21 MED ORDER — HYDROMORPHONE HCL 1 MG/ML IJ SOLN
0.5000 mg | INTRAMUSCULAR | Status: DC | PRN
  Administered 2015-02-21: 0.7 mg via INTRAVENOUS
  Administered 2015-02-21: 0.5 mg via INTRAVENOUS
  Filled 2015-02-21 (×2): qty 1

## 2015-02-21 MED ORDER — OXYCODONE HCL 5 MG PO TABS
5.0000 mg | ORAL_TABLET | ORAL | Status: DC | PRN
  Administered 2015-02-21: 5 mg via ORAL
  Administered 2015-02-21: 10 mg via ORAL
  Administered 2015-02-21: 5 mg via ORAL
  Administered 2015-02-22 (×3): 10 mg via ORAL
  Filled 2015-02-21: qty 2
  Filled 2015-02-21: qty 1
  Filled 2015-02-21 (×2): qty 2
  Filled 2015-02-21: qty 1
  Filled 2015-02-21: qty 2

## 2015-02-21 MED ORDER — OXYCODONE HCL 5 MG PO TABS: 15.0000 mg | ORAL_TABLET | ORAL | Status: DC | PRN

## 2015-02-21 MED ORDER — POTASSIUM CHLORIDE CRYS ER 20 MEQ PO TBCR
40.0000 meq | EXTENDED_RELEASE_TABLET | Freq: Two times a day (BID) | ORAL | Status: AC
  Administered 2015-02-21 (×2): 40 meq via ORAL
  Filled 2015-02-21 (×2): qty 2

## 2015-02-21 NOTE — Consult Note (Signed)
WOC ostomy follow up Stoma type/location: LLQ Colostomy Stomal assessment/size: 1 and 3/8 inches, round, more elevated on lateral edge than medial edge.Edematous. Peristomal assessment: Intact, clear Treatment options for stomal/peristomal skin: skin barrier ring Output soft brown stool Ostomy pouching: 1pc. Convex pouch with skin barrier ring.  Education provided: Patient is beginning to empty ostomy pouch today, will need support and reinforcement from bedside RN and NT.  Articulates steps in ostomy pouch change and applies hand to pouching system to achieve seal.  Taught about bathing and showering, swimming this summer.  Also pouch disposal at home where she burns her trash.  Obtaining supplies post discharge through [employer] taught. Patient asking appropriate questions and indicates good understanding.  Needs HHRN to assist with support while becoming independent in pouch change.  If you agree, please order. Enrolled patient in [employer] [place] [employer] Discharge program: Yes WOC nursing team will follow, and will remain available to this patient, the nursing, surgical and medical teams.  . Thanks, Ladona MowLaurie Mckaela Howley, MSN, RN, GNP, [employer], CWON-AP [phone])

## 2015-02-21 NOTE — Progress Notes (Signed)
Patient ID: Carla Herrera, female   DOB: [birth year], 49 y.o.   MRN: [id number] 4 Days Post-Op  Subjective: Pt tearful at times.  She walked twice yesterday, but still with some pain issues.  Tolerating clear liquids and now has output in her bag.  Objective: Vital signs in last 24 hours: Temp:  [98.2 F (36.8 C)-99.4 F (37.4 C)] 98.8 F (37.1 C) (06/27 0517) Pulse Rate:  [57-67] 57 (06/27 0517) Resp:  [13-18] 18 (06/27 0753) BP: (98-107)/(42-60) 107/60 mmHg (06/27 0517) SpO2:  [94 %-99 %] 99 % (06/27 0753) Last BM Date: 02/17/15  Intake/Output from previous day: 06/26 0701 - 06/27 0700 In: 3114.2 [P.O.:360; I.V.:2754.2] Out: 2095 [Urine:2050; Stool:45] Intake/Output this shift:    PE: Abd: soft, appropriately tender, lower midline wound is clean and packed, other incisions are healing well with staples intact, stoma is pink and viable, with air and liquid stool in bag.    Lab Results:   Recent Labs  02/20/15 0455  WBC 8.4  HGB 10.3*  HCT 32.3*  PLT 227   BMET  Recent Labs  02/20/15 0455 02/21/15 0539  NA 137 131*  K 3.2* 3.3*  CL 98* 98*  CO2 33* 26  GLUCOSE 100* 119*  BUN <5* 12  CREATININE 0.71 0.61  CALCIUM 8.0* 8.3*   PT/INR No results for input(s): LABPROT, INR in the last 72 hours. CMP     Component Value Date/Time   NA 131* 02/21/2015 0539   K 3.3* 02/21/2015 0539   CL 98* 02/21/2015 0539   CO2 26 02/21/2015 0539   GLUCOSE 119* 02/21/2015 0539   BUN 12 02/21/2015 0539   CREATININE 0.61 02/21/2015 0539   CALCIUM 8.3* 02/21/2015 0539   GFRNONAA >60 02/21/2015 0539   GFRAA >60 02/21/2015 0539   Lipase  No results found for: LIPASE     Studies/Results: No results found.  Anti-infectives: Anti-infectives    Start     Dose/Rate Route Frequency Ordered Stop   02/17/15 1230  cefoTEtan (CEFOTAN) 2 g in dextrose 5 % 50 mL IVPB  Status:  Discontinued     2 g 100 mL/hr over 30 Minutes Intravenous On call to O.R. 02/16/15 1526 02/17/15 1003    02/14/15 1500  ertapenem (INVANZ) 1 g in sodium chloride 0.9 % 50 mL IVPB     1 g 100 mL/hr over 30 Minutes Intravenous Every 24 hours 02/14/15 1405     02/12/15 1700  piperacillin-tazobactam (ZOSYN) IVPB 3.375 g  Status:  Discontinued     3.375 g 12.5 mL/hr over 240 Minutes Intravenous Every 8 hours 02/12/15 1602 02/14/15 1405   02/12/15 1600  metroNIDAZOLE (FLAGYL) IVPB 500 mg  Status:  Discontinued     500 mg 100 mL/hr over 60 Minutes Intravenous Every 8 hours 02/12/15 1559 02/14/15 1405   02/11/15 1500  cefoTEtan (CEFOTAN) 2 g in dextrose 5 % 50 mL IVPB  Status:  Discontinued     2 g 100 mL/hr over 30 Minutes Intravenous Every 12 hours 02/11/15 1355 02/12/15 1559   02/11/15 1400  ampicillin (OMNIPEN) 2 g in sodium chloride 0.9 % 50 mL IVPB  Status:  Discontinued     2 g 150 mL/hr over 20 Minutes Intravenous 4 times per day 02/11/15 1349 02/11/15 1354   02/11/15 1400  clindamycin (CLEOCIN) IVPB 900 mg  Status:  Discontinued     900 mg 100 mL/hr over 30 Minutes Intravenous 3 times per day 02/11/15 1349 02/11/15 1354   02/11/15 1400  doxycycline (VIBRAMYCIN) 100 mg in dextrose 5 % 250 mL IVPB  Status:  Discontinued     100 mg 125 mL/hr over 120 Minutes Intravenous Every 12 hours 02/11/15 1355 02/12/15 1559       Assessment/Plan   POD 4, s/p Hartman's for sigmoid diverticulitis with microperforation -Invanz D4/7 (post op) -mobilize and pulm toilet -advance to full liqiuds -DC PCA, start oral oxy 5-15mg  q 4 hours prn pain, IV dialudid q4h for breakthrough -will go ahead and set up [facility] for dressing changes and ostomy care DVT prophylaxis -SCDs/heparin  LOS: 10 days    Kalief Kattner E 02/21/2015, 8:53 AM Pager: [phone]

## 2015-02-22 LAB — BASIC METABOLIC PANEL
Anion gap: 6 (ref 5–15)
CO2: 28 mmol/L (ref 22–32)
CREATININE: 0.77 mg/dL (ref 0.44–1.00)
Calcium: 8.2 mg/dL — ABNORMAL LOW (ref 8.9–10.3)
Chloride: 102 mmol/L (ref 101–111)
GFR calc Af Amer: >60 mL/min (ref >60)
GLUCOSE: 104 mg/dL — AB (ref 65–99)
Potassium: 4.4 mmol/L (ref 3.5–5.1)
Sodium: 136 mmol/L (ref 135–145)

## 2015-02-22 MED ORDER — AMOXICILLIN-POT CLAVULANATE 875-125 MG PO TABS: 1.0000 | ORAL_TABLET | Freq: Two times a day (BID) | ORAL | Status: DC

## 2015-02-22 MED ORDER — OXYCODONE HCL 5 MG PO TABS: 5.0000 mg | ORAL_TABLET | ORAL | Status: DC | PRN

## 2015-02-22 NOTE — Discharge Instructions (Signed)
[facility]      [facility] °[phone] ° °OPEN ABDOMINAL SURGERY: POST OP INSTRUCTIONS ° °Always review your discharge instruction sheet given to you by the facility where your surgery was performed. ° °IF YOU HAVE DISABILITY OR FAMILY LEAVE FORMS, YOU MUST BRING THEM TO THE OFFICE FOR PROCESSING.  PLEASE DO NOT GIVE THEM TO YOUR DOCTOR. ° °1. A prescription for pain medication may be given to you upon discharge.  Take your pain medication as prescribed, if needed.  If narcotic pain medicine is not needed, then you may take acetaminophen (Tylenol) or ibuprofen (Advil) as needed. °2. Take your usually prescribed medications unless otherwise directed. °3. If you need a refill on your pain medication, please contact your pharmacy. They will contact our office to request authorization.  Prescriptions will not be filled after 5pm or on week-ends. °4. You should follow a light diet the first few days after arrival home, such as soup and crackers, pudding, etc.unless your doctor has advised otherwise. A high-fiber, low fat diet can be resumed as tolerated.   Be sure to include lots of fluids daily. Most patients will experience some swelling and bruising on the chest and neck area.  Ice packs will help.  Swelling and bruising can take several days to resolve °5. Most patients will experience some swelling and bruising in the area of the incision. Ice pack will help. Swelling and bruising can take several days to resolve..  °6. It is common to experience some constipation if taking pain medication after surgery.  Increasing fluid intake and taking a stool softener will usually help or prevent this problem from occurring.  A mild laxative (Milk of Magnesia or Miralax) should be taken according to package directions if there are no bowel movements after 48 hours. °7.  You may have steri-strips (small skin tapes) in place directly over the incision.  These strips should be left on the skin for 7-10 days.  If your  surgeon used skin glue on the incision, you may shower in 24 hours.  The glue will flake off over the next 2-3 weeks.  Any sutures or staples will be removed at the office during your follow-up visit. You may find that a light gauze bandage over your incision may keep your staples from being rubbed or pulled. You may shower and replace the bandage daily. °8. ACTIVITIES:  You may resume regular (light) daily activities beginning the next day--such as daily self-care, walking, climbing stairs--gradually increasing activities as tolerated.  You may have sexual intercourse when it is comfortable.  Refrain from any heavy lifting or straining until approved by your doctor. °a. You may drive when you no longer are taking prescription pain medication, you can comfortably wear a seatbelt, and you can safely maneuver your car and apply brakes °b. Return to Work: ___________________________________ °9. You should see your doctor in the office for a follow-up appointment approximately two weeks after your surgery.  Make sure that you call for this appointment within a day or two after you arrive home to insure a convenient appointment time. °OTHER INSTRUCTIONS:  °_____________________________________________________________ °_____________________________________________________________ ° °WHEN TO CALL YOUR DOCTOR: °1. Fever over 101.0 °2. Inability to urinate °3. Nausea and/or vomiting °4. Extreme swelling or bruising °5. Continued bleeding from incision. °6. Increased pain, redness, or drainage from the incision. °7. Difficulty swallowing or breathing °8. Muscle cramping or spasms. °9. Numbness or tingling in hands or feet or around lips. ° °The clinic staff is available to   answer your questions during regular business hours.  Please don’t hesitate to call and ask to speak to one of the nurses if you have concerns. ° °For further questions, please visit [email or web address] ° °Colostomy Home Guide °A colostomy is an  opening for stool to leave your body when a medical condition prevents it from leaving through the usual opening (rectum). During a surgery, a piece of large intestine (colon) is brought through a hole in the abdominal wall. The new opening is called a stoma or ostomy. A bag or pouch fits over the stoma to catch stool and gas. Your stool may be liquid, somewhat pasty, or formed. °CARING FOR YOUR STOMA  °Normally, the stoma looks a lot like the inside of your cheek: pink, red, and moist. At first it may be swollen, but this swelling will decrease within 6 weeks. °Keep the skin around your stoma clean and dry. You can gently wash your stoma and the skin around your stoma in the shower with a clean, soft washcloth. If you develop any skin irritation, your caregiver may give you a stoma powder or ointment to help heal the area. Do not use any products other than those specifically given to you by your caregiver.  °Your stoma should not be uncomfortable. If you notice any stinging or burning, your pouch may be leaking, and the skin around your stoma may be coming into contact with stool. This can cause skin irritation. If you notice stinging, replace your pouch with a new one and discard the old one. °OSTOMY POUCHES  °The pouch that fits over the ostomy can be made up of either 1 or 2 pieces. A one-piece pouch has a skin barrier piece and the pouch itself in one unit. A two-piece pouch has a skin barrier with a separate pouch that snaps on and off of the skin barrier. Either way, you should empty the pouch when it is only  to ½ full. Do not let more stool or gas build up. This could cause the pouch to leak. °Some ostomy bags have a built-in gas release valve. Ostomy deodorizer (5 drops) can be put into the pouch to prevent odor. Some people use ostomy lubricant drops inside the pouch to help the stool slide out of the bag more easily and completely.  °EMPTYING YOUR OSTOMY POUCH  °You may get lessons on how to empty your  pouch from a wound-ostomy nurse before you leave the hospital. Here are the basic steps: °· Wash your hands with soap and water. °· Sit far back on the toilet. °· Put several pieces of toilet paper into the toilet water. This will prevent splashing as you empty the stool into the toilet bowl. °· Unclip or unvelcro the tail end of the pouch. °· Unroll the tail and empty stool into the toilet. °· Clean the tail with toilet paper. °· Reroll the tail, and clip or velcro it closed. °· Wash your hands again. °CHANGING YOUR OSTOMY POUCH  °Change your ostomy pouch about every 3 to 4 days for the first 6 weeks, then every 5 to7 days. Always change the bag sooner if there is any leakage or you begin to notice any discomfort or irritation of the skin around the stoma. When possible, plan to change your ostomy pouch before eating or drinking as this will lessen the chance of stool coming out during the pouch change. A wound-ostomy nurse may teach you how to change your pouch before you leave the hospital. Here are   the basic steps: °· Lay out your supplies. °· Wash your hands with soap and water. °· Carefully remove the old pouch. °· Wash the stoma and allow it to dry. Men may be advised to shave any hair around the stoma very carefully. This will make the adhesive stick better. °· Use the stoma measuring guide that comes with your pouch set to decide what size hole you will need to cut in the skin barrier piece. Choose the smallest possible size that will hold the stoma but will not touch it. °· Use the guide to trace the circle on the back of the skin barrier piece. Cut out the hole. °· Hold the skin barrier piece over the stoma to make sure the hole is the correct size. °· Remove the adhesive paper backing from the skin barrier piece. °· Squeeze stoma paste around the opening of the skin barrier piece. °· Clean and dry the skin around the stoma again. °· Carefully fit the skin barrier piece over your stoma. °· If you are  using a two-piece pouch, snap the pouch onto the skin barrier piece. °· Close the tail of the pouch. °· Put your hand over the top of the skin barrier piece to help warm it for about 5 minutes, so that it conforms to your body better. °· Wash your hands again. °DIET TIPS  °· Continue to follow your usual diet. °· Drink about eight 8 oz glasses of water each day. °· You can prevent gas by eating slowly and chewing your food thoroughly. °· If you feel concerned that you have too much gas, you can cut back on gas-producing foods, such as: °¨ Spicy foods. °¨ Onions and garlic. °¨ Cruciferous vegetables (cabbage, broccoli, cauliflower, Brussels sprouts). °¨ Beans and legumes. °¨ Some cheeses. °¨ Eggs. °¨ Fish. °¨ Bubbly (carbonated) drinks. °¨ Chewing gum. °GENERAL TIPS  °· You can shower with or without the bag in place. °· Always keep the bag on if you are bathing or swimming. °· If your bag gets wet, you can dry it with a blow-dryer set to cool. °· Avoid wearing tight clothing directly over your stoma so that it does not become irritated or bleed. Tight clothing can also prevent stool from draining into the pouch. °· It is helpful to always have an extra skin barrier and pouch with you when traveling. Do not leave them anywhere too warm, as parts of them can melt. °· Do not let your seat belt rest on your stoma. Try to keep the seat belt either above or below your stoma, or use a tiny pillow to cushion it. °· You can still participate in sports, but you should avoid activities in which there is a risk of getting hit in the abdomen. °· You can still have sex. It is a good idea to empty your pouch prior to sex. Some people and their partners feel very comfortable seeing the pouch during sex. Others choose to wear lingerie or a T-shirt that covers the device. °SEEK IMMEDIATE MEDICAL CARE IF: °· You notice a change in the size or color of the stoma, especially if it becomes very red, purple, black, or pale white. °· You  have bloody stools or bleeding from the stoma. °· You have abdominal pain, nausea, vomiting, or bloating. °· There is anything unusual protruding from the stoma. °· You have irritation or red skin around the stoma. °· No stool is passing from the stoma. °· You have diarrhea (requiring more frequent   than normal pouch emptying). °Document Released: 08/16/2003 Document Revised: 11/05/2011 Document Reviewed: 01/10/2011 °ExitCare® Patient Information ©2015 [employer]. This information is not intended to replace advice given to you by your health care provider. Make sure you discuss any questions you have with your health care provider. ° °Dressing Change °A dressing is a material placed over wounds. It keeps the wound clean, dry, and protected from further injury. This provides an environment that favors wound healing.  °BEFORE YOU BEGIN °· Get your supplies together. Things you may need include: °¨ Saline solution. °¨ Flexible gauze dressing. °¨ Medicated cream. °¨ Tape. °¨ Gloves. °¨ Abdominal dressing pads. °¨ Gauze squares. °¨ Plastic bags. °· Take pain medicine 30 minutes before the dressing change if you need it. °· Take a shower before you do the first dressing change of the day. Use plastic wrap or a plastic bag to prevent the dressing from getting wet. °REMOVING YOUR OLD DRESSING  °· Wash your hands with soap and water. Dry your hands with a clean towel. °· Put on your gloves. °· Remove any tape. °· Carefully remove the old dressing. If the dressing sticks, you may dampen it with warm water to loosen it, or follow your caregiver's specific directions. °· Remove any gauze or packing tape that is in your wound. °· Take off your gloves. °· Put the gloves, tape, gauze, or any packing tape into a plastic bag. °CHANGING YOUR DRESSING °· Open the supplies. °· Take the cap off the saline solution. °· Open the gauze package so that the gauze remains on the inside of the package. °· Put on your gloves. °· Clean your  wound as told by your caregiver. °· If you have been told to keep your wound dry, follow those instructions. °· Your caregiver may tell you to do one or more of the following: °¨ Pick up the gauze. Pour the saline solution over the gauze. Squeeze out the extra saline solution. °¨ Put medicated cream or other medicine on your wound if you have been told to do so. °¨ Put the solution soaked gauze only in your wound, not on the skin around it. °¨ Pack your wound loosely or as told by your caregiver. °¨ Put dry gauze on your wound. °¨ Put abdominal dressing pads over the dry gauze if your wet gauze soaks through. °· Tape the abdominal dressing pads in place so they will not fall off. Do not wrap the tape completely around the affected part (arm, leg, abdomen). °· Wrap the dressing pads with a flexible gauze dressing to secure it in place. °· Take off your gloves. Put them in the plastic bag with the old dressing. Tie the bag shut and throw it away. °· Keep the dressing clean and dry until your next dressing change. °· Wash your hands. °SEEK MEDICAL CARE IF: °· Your skin around the wound looks red. °· Your wound feels more tender or sore. °· You see pus in the wound. °· Your wound smells bad. °· You have a fever. °· Your skin around the wound has a rash that itches and burns. °· You see black or yellow skin in your wound that was not there before. °· You feel nauseous, throw up, and feel very tired. °Document Released: 09/20/2004 Document Revised: 11/05/2011 Document Reviewed: 06/25/2011 °ExitCare® Patient Information ©2015 [employer]. This information is not intended to replace advice given to you by your health care provider. Make sure you discuss any questions you have with your   health care provider. ° °

## 2015-02-22 NOTE — Progress Notes (Signed)
Discharge instructions reviewed with patient, patient comfortable with self emptying of her colostomy, emptying of colostomy performed well, [facility], patient tolerating her soft diet without complaints prior to discharge, oral pain medication effective for pain control, dressing to abdomen changed prior to patient's discharge, vital signs are stable Stanford BreedBracey, Val Schiavo N RN 4:42 PM 02-22-2015

## 2015-02-22 NOTE — Care Management Note (Signed)
Case Management Note  Patient Details  Name: Carla Herrera MRN: [id number] Date of Birth: 1965-09-30  Subjective/Objective:                   LAPAROSCOPIC ASSISTED PARTIAL COLECTOMY, COLOSTOMY (N/A) Action/Plan:  Discharge planning Expected Discharge Date:  02/22/15               Expected Discharge Plan:  [place]  In-House Referral:     Discharge planning Services  CM Consult, [facility]  Post Acute Care Choice:  Home Health Choice offered to:  Patient  DME Arranged:    DME Agency:     HH Arranged:  RN [place] Agency:  [place]  Status of Service:  Completed, signed off  Medicare Important Message Given:    Date Medicare IM Given:    Medicare IM give by:    Date Additional Medicare IM Given:    Additional Medicare Important Message give by:     If discussed at [place] of Stay Meetings, dates discussed:    Additional Comments: CM met with pt to arrange for [facility] services.  Pt will be seen by [facility] from [facility].  CM setup pt with [facility] for an appt with an Aeronautical engineer, and to establish follow up medical care.  Referral called to [facility] rep, Lecretia for [facility].  CM gave pt [employer] handout as a resource for ostomy supplies for Medicaid and charity clients.  CM also gave pt resources of [employer], [employer], and [employer] all of whom will send free ostomy supplies to new ostomates.  No other CM needs were communicated. Dellie Catholic, RN 02/22/2015, 11:57 AM

## 2015-02-22 NOTE — Discharge Summary (Signed)
Patient ID: [name] MRN: [id number] DOB/AGE: [birth year] 48 y.o.  Admit date: 02/11/2015 Discharge date: 02/22/2015  Procedures: laparoscopic assisted sigmoid colectomy with colostomy  Consults: None  Reason for Admission: Carla Herrera is a 49 y.o. (DOB: [birth year]) white female whose primary care physician is No primary care provider on file. (Dr. Vance Gather, [facility]). She is seeing Dr. Ronnald Ramp at [facility].  Her symptoms began with constipation on Monday, 02/07/2015. She took some mag citrate. By Wednesday, she was having abdominal pain. She had a colonoscopy over 10 years ago for possible C. Diff, but it was neg. Her colonoscopy was done in [place], but she can not remember who did it. Because of worsening pain, she was admitted to [facility] hospital on 02/10/2015 with possible PID.  I was called by Dr. Nelta Numbers about Carla Herrera who was admitted to [facility] hospital for possible PID. Dr. Cherly Hensen obtained a CT scan today which showed 4.5 mm nodule seen in right middle lobe. If the patient is at high risk for bronchogenic carcinoma, follow-up chest CT at 1 year is recommended. If the patient is at low risk, no follow-up is needed.  Sigmoid diverticulitis is noted with soft tissue gas noted around the involved colon in the pelvis consistent with perforation. Mild left hydroureteronephrosis is noted most likely due to the surrounding inflammation.   Because it looks like she has a primary colon probem, the patient has been transferred to [facility]. I discussed the case with medicine, and the fact that she has basically no medical issues, she is admitted to the surgery service.  She has had a prior open cholecystectomy, an appendectomy and a TL. She has no other GI issues.  Admission Diagnoses:  1. Acute diverticulitis with microperforation  Hospital Course: The patient was initially  admitted to [facility] hospital for what was thought to be PID; however, she got a CT scan that revealed acute diverticulitis with microperforation.  She was transferred to [facility] where we took over her care.  She was initially on zosyn, but due to a failure to improve and worsening pain, she was transitioned to [employer].  Her WBC had normalized on zosyn, but pain still persisted even on invanz.  After 5 days of conservative treatment a repeat CT scan was obtained.  She still has persistent tics, but no abscess, but she did have slightly more free air around the sigmoid colon.  Therefore, the decision was made to take her to the OR the following day where she underwent the above procedure.  She tolerated this quite well.  She initially had a slight ileus, but once she began passing some air in her bag she was placed on clear liquids.  Her diet was able to be advanced as tolerated.  She did have an open midline wound which was packed BID with NS moistened gauze.  On POD 5, she was tolerating a soft diet.  She was otherwise stable for dc home with 2 more days of oral augmentin to complete a 7 day post operative course.  WOC was consulted for ostomy teaching and [facility] was arranged for routine ostomy care and dressing changes.  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     citalopram 40 MG tablet  Commonly known as:  CELEXA  Take 40 mg by mouth daily.     oxyCODONE 5 MG immediate release tablet  Commonly known as:  Oxy IR/ROXICODONE  Take 1-2 tablets (5-10 mg total) by mouth every 4 (four) hours as needed for moderate pain.        Discharge Instructions:     Follow-up Information    Follow up with [facility]    .   Why:  You have on Monday July 11 at 10:00 to get a primary care physician, insurance with a Navigator and follow up medical care   Contact information:   [address] [place] [phone]      Follow up with [facility].   Why:  home health nurse   Contact information:   [address] [place] [phone]       [facility] On 02/25/2015.   Specialty:  General Surgery   Why:  For suture removal, 3:45pm, arrive by 3:15pm for paperwork   Contact information:   [address] [place] [address] [phone]       Follow up with Adolph PollackOSENBOWER,TODD J, MD On 03/09/2015.   Specialty:  General Surgery   Why:  10:50am, arrive 10:35am to register   Contact information:   [address] [place] [address] [phone]       Signed: Letha CapeOSBORNE,Freedom Peddy E 02/22/2015, 12:10 PM

## 2015-02-22 NOTE — Consult Note (Signed)
WOC ostomy follow up Stoma type/location: LLQ Colostomy Education provided: Patient is being discharged today, per [place].  Patient has 8 pouching systems with  Barrier rings to take home.  She understands that [facility] will be coming our to reinforce teaching in the home setting.  She emptied pouch while WOC RN in room.  Questions answered regarding frequency of pouch change vs pouch emptying.  Understands to empty when 1/3 full.  Patient states she is comfortable with planned discharge today and feels confident in her own self care.  Enrolled patient in [place] Secure Start Discharge program: Yes Will not follow at this time.  Please re-consult if needed.  Maple HudsonKaren Ronal Maybury RN BSN CWON Pager [phone]

## 2015-03-07 ENCOUNTER — Ambulatory Visit: Payer: Self-pay | Attending: Family Medicine | Admitting: Family Medicine

## 2015-03-07 ENCOUNTER — Encounter: Payer: Self-pay | Admitting: Family Medicine

## 2015-03-07 VITALS — BP 114/73 | HR 82 | Temp 98.0°F | Ht 66.5 in | Wt 208.6 lb

## 2015-03-07 DIAGNOSIS — E0590 Thyrotoxicosis, unspecified without thyrotoxic crisis or storm: Secondary | ICD-10-CM | POA: Insufficient documentation

## 2015-03-07 DIAGNOSIS — Z9049 Acquired absence of other specified parts of digestive tract: Secondary | ICD-10-CM | POA: Insufficient documentation

## 2015-03-07 DIAGNOSIS — F32A Depression, unspecified: Secondary | ICD-10-CM

## 2015-03-07 DIAGNOSIS — F418 Other specified anxiety disorders: Secondary | ICD-10-CM

## 2015-03-07 DIAGNOSIS — F329 Major depressive disorder, single episode, unspecified: Secondary | ICD-10-CM | POA: Insufficient documentation

## 2015-03-07 DIAGNOSIS — F419 Anxiety disorder, unspecified: Secondary | ICD-10-CM | POA: Insufficient documentation

## 2015-03-07 DIAGNOSIS — K5732 Diverticulitis of large intestine without perforation or abscess without bleeding: Secondary | ICD-10-CM | POA: Insufficient documentation

## 2015-03-07 DIAGNOSIS — R911 Solitary pulmonary nodule: Secondary | ICD-10-CM | POA: Insufficient documentation

## 2015-03-07 DIAGNOSIS — R634 Abnormal weight loss: Secondary | ICD-10-CM | POA: Insufficient documentation

## 2015-03-07 LAB — T4, FREE: Free T4: 0.86 ng/dL (ref 0.80–1.80)

## 2015-03-07 LAB — T3, FREE: T3 FREE: 2.7 pg/mL (ref 2.3–4.2)

## 2015-03-07 LAB — TSH: TSH: 3.499 u[IU]/mL (ref 0.350–4.500)

## 2015-03-07 MED ORDER — CITALOPRAM HYDROBROMIDE 40 MG PO TABS: 40.0000 mg | ORAL_TABLET | Freq: Every day | ORAL | Status: AC

## 2015-03-07 NOTE — Progress Notes (Signed)
Patient here after recent sigmoidoscopy located in left lower quadrant-patient states she is caring for it at home well She reports no pain She says she has medical supply company set up for supplies and has application for [employer] discount She reports crying a lot and feeling very anxious post surgery and her Celexa is not helping.  Her boyfriend gave her a Xanax which helped but she states she is very sad and cannot sleep at night since this all happened She is hoping for a reversal in 3-6 months She also needs medical clearance to go back to work Has a P[facility] at [facility]

## 2015-03-07 NOTE — Progress Notes (Signed)
ASSESSMENT: Pt currently experiencing symptoms of depression, as a result of psychosocial life stressors, and needs to f/u with PCP. Pt would benefit from psychoeducation and supportive counseling regarding coping with symptoms of depression and changing psychosocial circumstances. Stage of Change: contemplative  PLAN: 1. F/U with behavioral health consultant in as needed 2. Psychiatric Medications: Celexa. 3. Behavioral recommendation(s):   -Keep financial counseling appointment at [facility] -Consider reading educational materials regarding coping with symptoms of depression -Consider [facility] as a Publishing rights managercommunity resource, as needed SUBJECTIVE: Pt. referred by Dr Venetia NightAmao for symptoms of depression:  Pt. here for referral regarding coping with symptoms of depression.  Pt. reports the following symptoms/concerns: Pt says that she has not felt depressed before now, that having to be out of work, even temporarily, is causing her to feel overwhelmed by the lack of finances; she worries about being able to pay her bills. She says she knows that it will get better, but that this situation is a new one for her, and not an easy adjustment.  Duration of problem: >one month Severity: moderate  OBJECTIVE: Orientation & Cognition: Oriented x3. Thought processes normal and appropriate to situation. Mood: teary. Affect: appropriate Appearance: appropriate Risk of harm to self or others: no risk of harm to self or others Substance use: none Psychiatric medication use: Unchanged from prior contact. Assessments administered: PSQ9-15/ GAD7-10  Diagnosis: Problem related to psychosocial circumstances CPT Code: Z65.9 -------------------------------------------- Other(s) present in the room: none  Time spent with patient in exam room: 20 minutes

## 2015-03-07 NOTE — Progress Notes (Signed)
Subjective:    Patient ID: Carla Herrera, female    DOB: [birth year], 49 y.o.   MRN: [id number]  HPI  Admit date: 02/11/15 Discharge date: 02/22/15  Carla Herrera is a 49 year old female with a history of depression who was [facility] for possible [facility]; the CT scan obtained revealed sigmoid diverticulitis with soft tissue gas noted around the involved colon consistent with perforation, mild hydroureteronephrosis, she also had a 4.645mm nodule in the right middle lobe; [facility]  She was seen by general surgery and was placed initially on conservative treatment for 5 days with Zosyn which was transitioned to IV Invanz but due to persisting pain and findings of increased air on sigmoid colon on CT scan the decision was made to take her to the OR where she underwent laparoscopic-assisted sigmoid colectomy with colostomy on 02/17/15. Postop she was nothing by mouth and her diet was transitioned slowly to clear liquids and advanced as tolerated. She received Augmentin. s well as ostomy teaching and home health arrangement for dressing changes. Once her condition stabilized she was discharged on 2 more days of Augmentin to complete a 7 day course.    Interval history: She reports doing well and denies any abdominal pain; she knows how to change her colostomy bag and has enough supplies. Had her sutures taken out on 02/25/15 and has an upcoming appointment with the general surgeon in 2 days. She complains of crying a lot for no reason and states has Celexa is not helping. She received Xanax from a friend which did help some. Denies suicidal ideation or intents. She complains of weight loss of over 30 lbs in the last 3 months which is unintentional; denies night sweats, diarrhea, does not smoke.   History reviewed. No pertinent past medical history.  Past Surgical History  Procedure Laterality Date  . Laparoscopic partial colectomy N/A  02/17/2015    Procedure: LAPAROSCOPIC ASSISTED PARTIAL COLECTOMY, COLOSTOMY;  Surgeon: Avel Peaceodd Rosenbower, MD;  Location: [facility] ORS;  Service: General;  Laterality: N/A;  . Uterine ablation  six years ago    Family History  Problem Relation Age of Onset  . COPD Mother   . Hypertension Mother   . Diabetes Mother   . Diabetes Father     History   Social History  . Marital Status: Divorced    Spouse Name: N/A  . Number of Children: N/A  . Years of Education: N/A   Occupational History  . Not on file.   Social History Main Topics  . Smoking status: Former Smoker -- 1.00 packs/day    Quit date: 02/10/2015  . Smokeless tobacco: Not on file  . Alcohol Use: No  . Drug Use: No  . Sexual Activity: Not on file   Other Topics Concern  . Not on file   Social History Narrative    Allergies  Allergen Reactions  . Ciprofloxacin     REACTION: Rash  . Meperidine Hcl     REACTION: convulsions    No current outpatient prescriptions on file prior to visit.   No current facility-administered medications on file prior to visit.      Review of Systems  Constitutional: Negative for activity change, appetite change and fatigue.  HENT: Negative for congestion, sinus pressure and sore throat.   Eyes: Negative for visual disturbance.  Respiratory: Negative for cough, chest tightness, shortness of breath and wheezing.   Cardiovascular: Negative for chest pain and palpitations.  Gastrointestinal: Negative for abdominal pain, constipation and abdominal distention.  Endocrine: Negative for polydipsia.  Genitourinary: Negative for dysuria and frequency.  Musculoskeletal: Negative for back pain and arthralgias.  Skin: Negative for rash.  Neurological: Negative for tremors, light-headedness and numbness.  Hematological: Does not bruise/bleed easily.  Psychiatric/Behavioral: Positive for dysphoric mood. Negative for suicidal ideas, behavioral problems and agitation. The patient is  nervous/anxious.          Objective: Filed Vitals:   03/07/15 1014  BP: 114/73  Pulse: 82  Temp: 98 F (36.7 C)  Height: 5' 6.5" (1.689 m)  Weight: 208 lb 9.6 oz (94.62 kg)  SpO2: 97%      Physical Exam  Constitutional: She is oriented to person, place, and time. She appears well-developed and well-nourished. No distress.  HENT:  Head: Normocephalic.  Right Ear: External ear normal.  Left Ear: External ear normal.  Nose: Nose normal.  Mouth/Throat: Oropharynx is clear and moist.  Eyes: Conjunctivae and EOM are normal. Pupils are equal, round, and reactive to light.  Neck: Normal range of motion. No JVD present.  Cardiovascular: Normal rate, regular rhythm, normal heart sounds and intact distal pulses.  Exam reveals no gallop.   No murmur heard. Pulmonary/Chest: Effort normal and breath sounds normal. No respiratory distress. She has no wheezes. She has no rales. She exhibits no tenderness.  Abdominal: Soft. Bowel sounds are normal.  Colostomy bag located on left lower quadrant of abdomen containing fecal matter. No sign of infection.  Musculoskeletal: Normal range of motion. She exhibits no edema or tenderness.  Neurological: She is alert and oriented to person, place, and time. She has normal reflexes.  Skin: Skin is warm and dry. She is not diaphoretic.  Psychiatric: She has a normal mood and affect.            Assessment & Plan:  49 year old patient with a history of depression who is status post laparoscopic assisted sigmoid colectomy/colostomy secondary to diverticulitis of the sigmoid colon who comes in here for follow-up.  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 Scheduled to see general surgery on 03/09/2015.  Depression and anxiety: Worsened anxiety due to underlying medical condition. Behavioral Health called in to see the patient; I have advised her that I am holding  off on short acting benzos as the habit forming. At her next visit when she establishes care with a PCP, she would need to be evaluated for the need for that.  Weight loss: Could be secondary to ongoing medical issues. However I am sending off a thyroid panel given she had a previous history of hyperthyroidism.  Pulmonary nodule: Will need a follow-up CT in 1 year.     Greater than half of the time spent in counseling on depression and anxiety his current exacerbation due to underlying medical condition.

## 2015-03-08 ENCOUNTER — Telehealth: Payer: Self-pay | Admitting: *Deleted

## 2015-03-08 ENCOUNTER — Encounter: Payer: Self-pay | Admitting: Family Medicine

## 2015-03-08 DIAGNOSIS — R911 Solitary pulmonary nodule: Secondary | ICD-10-CM | POA: Insufficient documentation

## 2015-03-08 NOTE — Progress Notes (Signed)
Quick Note:  Please inform the patient that labs are normal. Thank you. ______ 

## 2015-03-08 NOTE — Telephone Encounter (Signed)
Spoke with patient and verified name and date of birth-results of normal labs given.

## 2015-03-08 NOTE — Telephone Encounter (Signed)
-----   Message from Jaclyn ShaggyEnobong Amao, MD sent at 03/08/2015  8:21 AM EDT ----- Please inform the patient that labs are normal. Thank you.

## 2015-03-28 ENCOUNTER — Ambulatory Visit: Payer: Self-pay | Admitting: Family Medicine

## 2015-04-01 ENCOUNTER — Encounter: Payer: Self-pay | Admitting: Family Medicine

## 2015-04-01 ENCOUNTER — Ambulatory Visit: Payer: Self-pay | Attending: Family Medicine | Admitting: Family Medicine

## 2015-04-01 ENCOUNTER — Ambulatory Visit: Payer: Self-pay

## 2015-04-01 VITALS — BP 98/60 | HR 73 | Temp 98.5°F | Resp 16 | Ht 66.0 in | Wt 216.0 lb

## 2015-04-01 DIAGNOSIS — F329 Major depressive disorder, single episode, unspecified: Secondary | ICD-10-CM | POA: Insufficient documentation

## 2015-04-01 DIAGNOSIS — Z933 Colostomy status: Secondary | ICD-10-CM | POA: Insufficient documentation

## 2015-04-01 DIAGNOSIS — F458 Other somatoform disorders: Secondary | ICD-10-CM | POA: Insufficient documentation

## 2015-04-01 DIAGNOSIS — K5732 Diverticulitis of large intestine without perforation or abscess without bleeding: Secondary | ICD-10-CM | POA: Insufficient documentation

## 2015-04-01 DIAGNOSIS — D62 Acute posthemorrhagic anemia: Secondary | ICD-10-CM | POA: Insufficient documentation

## 2015-04-01 MED ORDER — METHOCARBAMOL 750 MG PO TABS: 750.0000 mg | ORAL_TABLET | Freq: Three times a day (TID) | ORAL | Status: AC | PRN

## 2015-04-01 NOTE — Assessment & Plan Note (Signed)
Teething grinding Robaxin refilled

## 2015-04-01 NOTE — Progress Notes (Signed)
   Subjective:    Patient ID: Carla Herrera, female    DOB: 10-28-1965, 49 y.o.   MRN: [id number] CC: establish care, HFU sigmoid diverticulitis, bruxism  HPI  49 yo F presents to establish care and discuss the following:  1. Sigmoid diverticulitis: s/p perforation requiring bowel resection and colostomy. Patient is doing well now. No pain. Developed anemia while in the hospital. Has ostomy supplies and teaching. The goal in takedown in Dec, 2016 or Jan, 2017. Patient is adjusting to ostomy. Has added increased fiber to diet.   2. Bruxism: takes robaxin for chronic bruxism. Has dental home. Wears mouth guard. Has generalized body tension and stiffness while sleeping.   3. Depression: chronic. On celexa. celexa works very well. Has tried other medications in the past. Denies SI.   History  Substance Use Topics  . Smoking status: Former Smoker -- 1.00 packs/day    Quit date: 02/10/2015  . Smokeless tobacco: Never Used  . Alcohol Use: No   Med Hx: Depression  Surg Hx: uterine ablation, partial colectomy  Review of Systems  Constitutional: Negative for fever and chills.  Eyes: Negative for visual disturbance.  Respiratory: Negative for shortness of breath.   Cardiovascular: Negative for chest pain.  Gastrointestinal: Negative for abdominal pain and blood in stool.  Musculoskeletal: Negative for back pain and arthralgias.  Skin: Negative for rash.  Allergic/Immunologic: Negative for immunocompromised state.  Hematological: Negative for adenopathy. Does not bruise/bleed easily.  Psychiatric/Behavioral: Negative for suicidal ideas and dysphoric mood.       Objective:   Physical Exam  Constitutional: She is oriented to person, place, and time. She appears well-developed and well-nourished. No distress.  HENT:  Head: Normocephalic and atraumatic.  Cardiovascular: Normal rate, regular rhythm, normal heart sounds and intact distal pulses.   Pulmonary/Chest: Effort normal and breath  sounds normal.  Abdominal: Soft. Bowel sounds are normal. She exhibits no distension.    Musculoskeletal: She exhibits no edema.  Neurological: She is alert and oriented to person, place, and time.  Skin: Skin is warm and dry. No rash noted.  Psychiatric: She has a normal mood and affect.  BP 98/60 mmHg  Pulse 73  Temp(Src) 98.5 F (36.9 C) (Oral)  Resp 16  Ht  (1.676 m)  Wt 216 lb (97.977 kg)  BMI 34.88 kg/m2  SpO2 98%  LMP 03/07/2015 Depression screen PHQ 2/9 04/01/2015 03/07/2015  Decreased Interest 0 0  Down, Depressed, Hopeless 0 2  PHQ - 2 Score 0 2  Altered sleeping - 3  Tired, decreased energy - 1  Change in appetite - 3  Feeling bad or failure about yourself  - 0  Trouble concentrating - 1  Moving slowly or fidgety/restless - 0  Suicidal thoughts - 0  PHQ-9 Score - 10          Assessment & Plan:

## 2015-04-01 NOTE — Assessment & Plan Note (Signed)
Anemia acquired during recent diverticulitis  CBC today

## 2015-04-01 NOTE — Patient Instructions (Signed)
Carla Herrera  Thank you for coming in today. It was a pleasure meeting you. I look forward to being your primary doctor.   1. Anemia acquired during recent diverticulitis  CBC today  2. Teething grinding Robaxin refilled  3. Healthcare maintenance: Will obtain records of recent pap and screening HIV  F/u in 6-8 weeks with RN for Tdap and flu  F/u with me in 3 months to f/u colostomy status   Dr. Armen Pickup

## 2015-04-01 NOTE — Progress Notes (Signed)
Establish Care with PCP No complain today No tobacco user

## 2015-04-02 LAB — CBC
HCT: 39.6 % (ref 36.0–46.0)
Hemoglobin: 13.2 g/dL (ref 12.0–15.0)
MCH: 30.3 pg (ref 26.0–34.0)
MCHC: 33.3 g/dL (ref 30.0–36.0)
MCV: 91 fL (ref 78.0–100.0)
MPV: 9.8 fL (ref 8.6–12.4)
Platelets: 247 10*3/uL (ref 150–400)
RBC: 4.35 MIL/uL (ref 3.87–5.11)
RDW: 14.3 % (ref 11.5–15.5)
WBC: 9.9 10*3/uL (ref 4.0–10.5)

## 2015-04-06 ENCOUNTER — Ambulatory Visit: Payer: Self-pay | Attending: Family Medicine

## 2015-04-12 ENCOUNTER — Telehealth: Payer: Self-pay | Admitting: Clinical

## 2015-04-12 NOTE — Telephone Encounter (Signed)
Attempt to return message from pt; left HIPPA-compliant message for pt to call Asher Muir at [facility] at [phone].

## 2015-04-28 ENCOUNTER — Other Ambulatory Visit: Payer: Self-pay | Admitting: General Surgery

## 2015-04-28 ENCOUNTER — Other Ambulatory Visit: Payer: Self-pay

## 2015-04-28 DIAGNOSIS — K5792 Diverticulitis of intestine, part unspecified, without perforation or abscess without bleeding: Secondary | ICD-10-CM

## 2015-04-28 DIAGNOSIS — K5732 Diverticulitis of large intestine without perforation or abscess without bleeding: Secondary | ICD-10-CM

## 2015-05-03 ENCOUNTER — Other Ambulatory Visit: Payer: Self-pay

## 2015-05-12 ENCOUNTER — Ambulatory Visit
Admission: RE | Admit: 2015-05-12 | Discharge: 2015-05-12 | Disposition: A | Discharge status: Home / Self Care | Payer: 59 | Source: Ambulatory Visit | Attending: General Surgery | Admitting: General Surgery

## 2015-06-09 ENCOUNTER — Other Ambulatory Visit: Payer: Self-pay | Admitting: General Surgery

## 2015-06-29 NOTE — Patient Instructions (Signed)
Carla Herrera  06/29/2015   Your procedure is scheduled on: 07/07/2015    Report to [facility] Main  Entrance take [employer]  elevators to 3rd floor to  Short Stay Center at     0530 AM.  Call this number if you have problems the morning of surgery [phone]   Remember: ONLY 1 PERSON MAY GO WITH YOU TO SHORT STAY TO GET  READY MORNING OF YOUR SURGERY.  Do not eat food or drink liquids :After Midnight.     Take these medicines the morning of surgery with A SIP OF WATER: Celexa                                 You may not have any metal on your body including hair pins and              piercings  Do not wear jewelry, make-up, lotions, powders or perfumes, deodorant             Do not wear nail polish.  Do not shave  48 hours prior to surgery.                Do not bring valuables to the hospital. [facility] IS NOT             RESPONSIBLE   FOR VALUABLES.  Contacts, dentures or bridgework may not be worn into surgery.  Leave suitcase in the car. After surgery it may be brought to your room.         Special Instructions: coughing and deep breathing exercises, leg exercises               Please read over the following fact sheets you were given: _____________________________________________________________________             [facility] - Preparing for Surgery Before surgery, you can play an important role.  Because skin is not sterile, your skin needs to be as free of germs as possible.  You can reduce the number of germs on your skin by washing with CHG (chlorahexidine gluconate) soap before surgery.  CHG is an antiseptic cleaner which kills germs and bonds with the skin to continue killing germs even after washing. Please DO NOT use if you have an allergy to CHG or antibacterial soaps.  If your skin becomes reddened/irritated stop using the CHG and inform your nurse when you arrive at Short Stay. Do not shave (including legs and underarms) for at least 48  hours prior to the first CHG shower.  You may shave your face/neck. Please follow these instructions carefully:  1.  Shower with CHG Soap the night before surgery and the  morning of Surgery.  2.  If you choose to wash your hair, wash your hair first as usual with your  normal  shampoo.  3.  After you shampoo, rinse your hair and body thoroughly to remove the  shampoo.                           4.  Use CHG as you would any other liquid soap.  You can apply chg directly  to the skin and wash  Gently with a scrungie or clean washcloth.  5.  Apply the CHG Soap to your body ONLY FROM THE NECK DOWN.   Do not use on face/ open                           Wound or open sores. Avoid contact with eyes, ears mouth and genitals (private parts).                       Wash face,  Genitals (private parts) with your normal soap.             6.  Wash thoroughly, paying special attention to the area where your surgery  will be performed.  7.  Thoroughly rinse your body with warm water from the neck down.  8.  DO NOT shower/wash with your normal soap after using and rinsing off  the CHG Soap.                9.  Pat yourself dry with a clean towel.            10.  Wear clean pajamas.            11.  Place clean sheets on your bed the night of your first shower and do not  sleep with pets. Day of Surgery : Do not apply any lotions/deodorants the morning of surgery.  Please wear clean clothes to the hospital/surgery center.  FAILURE TO FOLLOW THESE INSTRUCTIONS MAY RESULT IN THE CANCELLATION OF YOUR SURGERY PATIENT SIGNATURE_________________________________  NURSE SIGNATURE__________________________________  ________________________________________________________________________  WHAT IS A BLOOD TRANSFUSION? Blood Transfusion Information  A transfusion is the replacement of blood or some of its parts. Blood is made up of multiple cells which provide different functions.  Red blood cells  carry oxygen and are used for blood loss replacement.  White blood cells fight against infection.  Platelets control bleeding.  Plasma helps clot blood.  Other blood products are available for specialized needs, such as hemophilia or other clotting disorders. BEFORE THE TRANSFUSION  Who gives blood for transfusions?   Healthy volunteers who are fully evaluated to make sure their blood is safe. This is blood bank blood. Transfusion therapy is the safest it has ever been in the practice of medicine. Before blood is taken from a donor, a complete history is taken to make sure that person has no history of diseases nor engages in risky social behavior (examples are intravenous drug use or sexual activity with multiple partners). The donor's travel history is screened to minimize risk of transmitting infections, such as malaria. The donated blood is tested for signs of infectious diseases, such as HIV and hepatitis. The blood is then tested to be sure it is compatible with you in order to minimize the chance of a transfusion reaction. If you or a relative donates blood, this is often done in anticipation of surgery and is not appropriate for emergency situations. It takes many days to process the donated blood. RISKS AND COMPLICATIONS Although transfusion therapy is very safe and saves many lives, the main dangers of transfusion include:   Getting an infectious disease.  Developing a transfusion reaction. This is an allergic reaction to something in the blood you were given. Every precaution is taken to prevent this. The decision to have a blood transfusion has been considered carefully by your caregiver before blood is given. Blood is not given unless the benefits outweigh  the risks. AFTER THE TRANSFUSION  Right after receiving a blood transfusion, you will usually feel much better and more energetic. This is especially true if your red blood cells have gotten low (anemic). The transfusion raises  the level of the red blood cells which carry oxygen, and this usually causes an energy increase.  The nurse administering the transfusion will monitor you carefully for complications. HOME CARE INSTRUCTIONS  No special instructions are needed after a transfusion. You may find your energy is better. Speak with your caregiver about any limitations on activity for underlying diseases you may have. SEEK MEDICAL CARE IF:   Your condition is not improving after your transfusion.  You develop redness or irritation at the intravenous (IV) site. SEEK IMMEDIATE MEDICAL CARE IF:  Any of the following symptoms occur over the next 12 hours:  Shaking chills.  You have a temperature by mouth above 102 F (38.9 C), not controlled by medicine.  Chest, back, or muscle pain.  People around you feel you are not acting correctly or are confused.  Shortness of breath or difficulty breathing.  Dizziness and fainting.  You get a rash or develop hives.  You have a decrease in urine output.  Your urine turns a dark color or changes to pink, red, or brown. Any of the following symptoms occur over the next 10 days:  You have a temperature by mouth above 102 F (38.9 C), not controlled by medicine.  Shortness of breath.  Weakness after normal activity.  The white part of the eye turns yellow (jaundice).  You have a decrease in the amount of urine or are urinating less often.  Your urine turns a dark color or changes to pink, red, or brown. Document Released: 08/10/2000 Document Revised: 11/05/2011 Document Reviewed: 03/29/2008 [facility] Patient Information 2014 [place], [place].  _______________________________________________________________________

## 2015-06-30 ENCOUNTER — Encounter (HOSPITAL_COMMUNITY): Payer: Self-pay

## 2015-06-30 ENCOUNTER — Encounter (HOSPITAL_COMMUNITY)
Admission: RE | Admit: 2015-06-30 | Discharge: 2015-06-30 | Disposition: A | Discharge status: Home / Self Care | Payer: 59 | Source: Ambulatory Visit | Attending: General Surgery | Admitting: General Surgery

## 2015-06-30 DIAGNOSIS — Z933 Colostomy status: Secondary | ICD-10-CM | POA: Insufficient documentation

## 2015-06-30 DIAGNOSIS — Z01818 Encounter for other preprocedural examination: Secondary | ICD-10-CM | POA: Insufficient documentation

## 2015-06-30 HISTORY — DX: Hypothyroidism, unspecified: E03.9

## 2015-06-30 HISTORY — DX: Anemia, unspecified: D64.9

## 2015-06-30 HISTORY — DX: Diverticulitis of intestine, part unspecified, without perforation or abscess without bleeding: K57.92

## 2015-06-30 LAB — COMPREHENSIVE METABOLIC PANEL
ALT: 20 U/L (ref 14–54)
ANION GAP: 6 (ref 5–15)
AST: 22 U/L (ref 15–41)
Albumin: 4 g/dL (ref 3.5–5.0)
Alkaline Phosphatase: 71 U/L (ref 38–126)
BUN: 10 mg/dL (ref 6–20)
CHLORIDE: 105 mmol/L (ref 101–111)
CO2: 28 mmol/L (ref 22–32)
CREATININE: 0.79 mg/dL (ref 0.44–1.00)
Calcium: 9.2 mg/dL (ref 8.9–10.3)
Glucose, Bld: 93 mg/dL (ref 65–99)
POTASSIUM: 4.1 mmol/L (ref 3.5–5.1)
SODIUM: 139 mmol/L (ref 135–145)
Total Bilirubin: 0.3 mg/dL (ref 0.3–1.2)
Total Protein: 7.4 g/dL (ref 6.5–8.1)

## 2015-06-30 LAB — CBC WITH DIFFERENTIAL/PLATELET
Basophils Absolute: 0 10*3/uL (ref 0.0–0.1)
Basophils Relative: 0 %
EOS ABS: 0.1 10*3/uL (ref 0.0–0.7)
EOS PCT: 1 %
HCT: 40.5 % (ref 36.0–46.0)
Hemoglobin: 13.2 g/dL (ref 12.0–15.0)
LYMPHS ABS: 2.1 10*3/uL (ref 0.7–4.0)
LYMPHS PCT: 23 %
MCH: 30.7 pg (ref 26.0–34.0)
MCHC: 32.6 g/dL (ref 30.0–36.0)
MCV: 94.2 fL (ref 78.0–100.0)
MONO ABS: 0.6 10*3/uL (ref 0.1–1.0)
Monocytes Relative: 6 %
Neutro Abs: 6.2 10*3/uL (ref 1.7–7.7)
Neutrophils Relative %: 70 %
PLATELETS: 221 10*3/uL (ref 150–400)
RBC: 4.3 MIL/uL (ref 3.87–5.11)
RDW: 13.4 % (ref 11.5–15.5)
WBC: 9 10*3/uL (ref 4.0–10.5)

## 2015-06-30 LAB — PROTIME-INR
INR: 0.99 (ref 0.00–1.49)
PROTHROMBIN TIME: 13.3 s (ref 11.6–15.2)

## 2015-06-30 LAB — HCG, SERUM, QUALITATIVE: Preg, Serum: NEGATIVE

## 2015-07-07 ENCOUNTER — Inpatient Hospital Stay (HOSPITAL_COMMUNITY): Payer: 59 | Admitting: Certified Registered"

## 2015-07-07 ENCOUNTER — Inpatient Hospital Stay (HOSPITAL_COMMUNITY)
Admission: RE | Admit: 2015-07-07 | Discharge: 2015-07-12 | DRG: 331 | Disposition: A | Discharge status: Home / Self Care | Payer: 59 | Source: Ambulatory Visit | Attending: General Surgery | Admitting: General Surgery

## 2015-07-07 ENCOUNTER — Encounter (HOSPITAL_COMMUNITY)
Admission: RE | Disposition: A | Discharge status: Home / Self Care | Payer: Self-pay | Source: Ambulatory Visit | Attending: General Surgery

## 2015-07-07 ENCOUNTER — Encounter (HOSPITAL_COMMUNITY): Payer: Self-pay | Admitting: *Deleted

## 2015-07-07 DIAGNOSIS — Z01812 Encounter for preprocedural laboratory examination: Secondary | ICD-10-CM

## 2015-07-07 DIAGNOSIS — Z885 Allergy status to narcotic agent status: Secondary | ICD-10-CM

## 2015-07-07 DIAGNOSIS — Z9109 Other allergy status, other than to drugs and biological substances: Secondary | ICD-10-CM | POA: Diagnosis not present

## 2015-07-07 DIAGNOSIS — Z833 Family history of diabetes mellitus: Secondary | ICD-10-CM

## 2015-07-07 DIAGNOSIS — Z881 Allergy status to other antibiotic agents status: Secondary | ICD-10-CM | POA: Diagnosis not present

## 2015-07-07 DIAGNOSIS — E039 Hypothyroidism, unspecified: Secondary | ICD-10-CM | POA: Diagnosis present

## 2015-07-07 DIAGNOSIS — Z825 Family history of asthma and other chronic lower respiratory diseases: Secondary | ICD-10-CM | POA: Diagnosis not present

## 2015-07-07 DIAGNOSIS — F329 Major depressive disorder, single episode, unspecified: Secondary | ICD-10-CM | POA: Diagnosis present

## 2015-07-07 DIAGNOSIS — Z433 Encounter for attention to colostomy: Secondary | ICD-10-CM | POA: Diagnosis present

## 2015-07-07 DIAGNOSIS — Z79899 Other long term (current) drug therapy: Secondary | ICD-10-CM

## 2015-07-07 DIAGNOSIS — Z8249 Family history of ischemic heart disease and other diseases of the circulatory system: Secondary | ICD-10-CM

## 2015-07-07 DIAGNOSIS — Z933 Colostomy status: Secondary | ICD-10-CM

## 2015-07-07 DIAGNOSIS — K660 Peritoneal adhesions (postprocedural) (postinfection): Secondary | ICD-10-CM | POA: Diagnosis present

## 2015-07-07 DIAGNOSIS — Z87891 Personal history of nicotine dependence: Secondary | ICD-10-CM

## 2015-07-07 HISTORY — PX: OOPHORECTOMY: SHX6387

## 2015-07-07 HISTORY — PX: COLOSTOMY TAKEDOWN: SHX5258

## 2015-07-07 LAB — TYPE AND SCREEN
ABO/RH(D): O POS
Antibody Screen: NEGATIVE

## 2015-07-07 SURGERY — CLOSURE, COLOSTOMY, LAPAROSCOPIC
Anesthesia: General | Laterality: Right

## 2015-07-07 MED ORDER — ROCURONIUM BROMIDE 100 MG/10ML IV SOLN
INTRAVENOUS | Status: AC
  Filled 2015-07-07: qty 1

## 2015-07-07 MED ORDER — HYDROMORPHONE 1 MG/ML IV SOLN
INTRAVENOUS | Status: DC
  Administered 2015-07-07: 16:00:00 via INTRAVENOUS
  Filled 2015-07-07: qty 25

## 2015-07-07 MED ORDER — ONDANSETRON HCL 4 MG/2ML IJ SOLN: 4.0000 mg | Freq: Four times a day (QID) | INTRAMUSCULAR | Status: DC | PRN

## 2015-07-07 MED ORDER — MIDAZOLAM HCL 2 MG/2ML IJ SOLN
INTRAMUSCULAR | Status: DC | PRN
  Administered 2015-07-07: 2 mg via INTRAVENOUS

## 2015-07-07 MED ORDER — FENTANYL CITRATE (PF) 250 MCG/5ML IJ SOLN
INTRAMUSCULAR | Status: AC
  Filled 2015-07-07: qty 25

## 2015-07-07 MED ORDER — FENTANYL CITRATE (PF) 250 MCG/5ML IJ SOLN
INTRAMUSCULAR | Status: AC
  Filled 2015-07-07: qty 5

## 2015-07-07 MED ORDER — HYDROMORPHONE HCL 2 MG/ML IJ SOLN
INTRAMUSCULAR | Status: AC
  Filled 2015-07-07: qty 1

## 2015-07-07 MED ORDER — METHOCARBAMOL 500 MG PO TABS
750.0000 mg | ORAL_TABLET | Freq: Three times a day (TID) | ORAL | Status: DC | PRN
Start: 2015-07-07 — End: 2015-07-12
  Administered 2015-07-10 – 2015-07-11 (×3): 750 mg via ORAL
  Filled 2015-07-07 (×3): qty 2

## 2015-07-07 MED ORDER — SODIUM CHLORIDE 0.9 % IJ SOLN: 9.0000 mL | INTRAMUSCULAR | Status: DC | PRN

## 2015-07-07 MED ORDER — HYDROMORPHONE HCL 1 MG/ML IJ SOLN
0.2500 mg | INTRAMUSCULAR | Status: DC | PRN
Start: 2015-07-07 — End: 2015-07-07
  Administered 2015-07-07 (×4): 0.5 mg via INTRAVENOUS

## 2015-07-07 MED ORDER — DIPHENHYDRAMINE HCL 50 MG/ML IJ SOLN: 12.5000 mg | Freq: Four times a day (QID) | INTRAMUSCULAR | Status: DC | PRN

## 2015-07-07 MED ORDER — NEOSTIGMINE METHYLSULFATE 10 MG/10ML IV SOLN
INTRAVENOUS | Status: AC
  Filled 2015-07-07: qty 1

## 2015-07-07 MED ORDER — ROCURONIUM BROMIDE 100 MG/10ML IV SOLN
INTRAVENOUS | Status: AC
Start: 2015-07-07 — End: 2015-07-07
  Filled 2015-07-07: qty 1

## 2015-07-07 MED ORDER — NEOSTIGMINE METHYLSULFATE 10 MG/10ML IV SOLN
INTRAVENOUS | Status: DC | PRN
  Administered 2015-07-07: 4 mg via INTRAVENOUS

## 2015-07-07 MED ORDER — ONDANSETRON HCL 4 MG/2ML IJ SOLN: 4.0000 mg | INTRAMUSCULAR | Status: DC | PRN

## 2015-07-07 MED ORDER — GLYCOPYRROLATE 0.2 MG/ML IJ SOLN
INTRAMUSCULAR | Status: DC | PRN
  Administered 2015-07-07: 0.6 mg via INTRAVENOUS

## 2015-07-07 MED ORDER — ONDANSETRON HCL 4 MG/2ML IJ SOLN
INTRAMUSCULAR | Status: AC
  Filled 2015-07-07: qty 2

## 2015-07-07 MED ORDER — ONDANSETRON HCL 4 MG/2ML IJ SOLN
INTRAMUSCULAR | Status: DC | PRN
  Administered 2015-07-07: 4 mg via INTRAVENOUS

## 2015-07-07 MED ORDER — HYDROMORPHONE 1 MG/ML IV SOLN: INTRAVENOUS | Status: DC

## 2015-07-07 MED ORDER — PROMETHAZINE HCL 25 MG/ML IJ SOLN: 6.2500 mg | INTRAMUSCULAR | Status: DC | PRN

## 2015-07-07 MED ORDER — GLYCOPYRROLATE 0.2 MG/ML IJ SOLN
INTRAMUSCULAR | Status: AC
  Filled 2015-07-07: qty 3

## 2015-07-07 MED ORDER — PANTOPRAZOLE SODIUM 40 MG IV SOLR
40.0000 mg | INTRAVENOUS | Status: DC
  Administered 2015-07-07 – 2015-07-09 (×3): 40 mg via INTRAVENOUS
  Filled 2015-07-07 (×4): qty 40

## 2015-07-07 MED ORDER — NALOXONE HCL 0.4 MG/ML IJ SOLN: 0.4000 mg | INTRAMUSCULAR | Status: DC | PRN

## 2015-07-07 MED ORDER — KCL IN DEXTROSE-NACL 20-5-0.9 MEQ/L-%-% IV SOLN
INTRAVENOUS | Status: DC
  Administered 2015-07-07 – 2015-07-11 (×10): via INTRAVENOUS
  Filled 2015-07-07 (×11): qty 1000

## 2015-07-07 MED ORDER — LABETALOL HCL 5 MG/ML IV SOLN
INTRAVENOUS | Status: AC
  Filled 2015-07-07: qty 4

## 2015-07-07 MED ORDER — ALVIMOPAN 12 MG PO CAPS: 12.0000 mg | ORAL_CAPSULE | Freq: Two times a day (BID) | ORAL | Status: DC

## 2015-07-07 MED ORDER — MIDAZOLAM HCL 2 MG/2ML IJ SOLN
INTRAMUSCULAR | Status: AC
  Filled 2015-07-07: qty 4

## 2015-07-07 MED ORDER — CEFOTETAN DISODIUM-DEXTROSE 2-2.08 GM-% IV SOLR
INTRAVENOUS | Status: AC
  Filled 2015-07-07: qty 50

## 2015-07-07 MED ORDER — GLYCOPYRROLATE 0.2 MG/ML IJ SOLN
INTRAMUSCULAR | Status: AC
  Filled 2015-07-07: qty 1

## 2015-07-07 MED ORDER — FENTANYL CITRATE (PF) 100 MCG/2ML IJ SOLN
INTRAMUSCULAR | Status: AC
  Filled 2015-07-07: qty 2

## 2015-07-07 MED ORDER — DIPHENHYDRAMINE HCL 12.5 MG/5ML PO ELIX: 12.5000 mg | ORAL_SOLUTION | Freq: Four times a day (QID) | ORAL | Status: DC | PRN

## 2015-07-07 MED ORDER — LACTATED RINGERS IV SOLN
INTRAVENOUS | Status: DC | PRN
  Administered 2015-07-07 (×3): via INTRAVENOUS

## 2015-07-07 MED ORDER — PROPOFOL 10 MG/ML IV BOLUS
INTRAVENOUS | Status: AC
  Filled 2015-07-07: qty 20

## 2015-07-07 MED ORDER — LIDOCAINE HCL (CARDIAC) 20 MG/ML IV SOLN
INTRAVENOUS | Status: AC
  Filled 2015-07-07: qty 5

## 2015-07-07 MED ORDER — MORPHINE SULFATE 2 MG/ML IV SOLN
INTRAVENOUS | Status: DC
  Administered 2015-07-07: 13:00:00 via INTRAVENOUS

## 2015-07-07 MED ORDER — 0.9 % SODIUM CHLORIDE (POUR BTL) OPTIME
TOPICAL | Status: DC | PRN
  Administered 2015-07-07: 3000 mL

## 2015-07-07 MED ORDER — CITALOPRAM HYDROBROMIDE 40 MG PO TABS
40.0000 mg | ORAL_TABLET | Freq: Every day | ORAL | Status: DC
  Administered 2015-07-08 – 2015-07-11 (×4): 40 mg via ORAL
  Filled 2015-07-07 (×5): qty 1

## 2015-07-07 MED ORDER — FENTANYL CITRATE (PF) 250 MCG/5ML IJ SOLN
INTRAMUSCULAR | Status: DC | PRN
  Administered 2015-07-07: 50 ug via INTRAVENOUS
  Administered 2015-07-07: 75 ug via INTRAVENOUS
  Administered 2015-07-07 (×2): 50 ug via INTRAVENOUS
  Administered 2015-07-07: 100 ug via INTRAVENOUS
  Administered 2015-07-07 (×2): 50 ug via INTRAVENOUS
  Administered 2015-07-07: 25 ug via INTRAVENOUS

## 2015-07-07 MED ORDER — MORPHINE SULFATE 2 MG/ML IV SOLN
INTRAVENOUS | Status: AC
  Administered 2015-07-07: 6 mg
  Filled 2015-07-07: qty 25

## 2015-07-07 MED ORDER — CEFOTETAN DISODIUM-DEXTROSE 2-2.08 GM-% IV SOLR
2.0000 g | INTRAVENOUS | Status: AC
  Administered 2015-07-07: 2 g via INTRAVENOUS

## 2015-07-07 MED ORDER — DEXAMETHASONE SODIUM PHOSPHATE 10 MG/ML IJ SOLN
INTRAMUSCULAR | Status: AC
  Filled 2015-07-07: qty 1

## 2015-07-07 MED ORDER — KETOROLAC TROMETHAMINE 30 MG/ML IJ SOLN
30.0000 mg | Freq: Four times a day (QID) | INTRAMUSCULAR | Status: DC
  Administered 2015-07-07 – 2015-07-08 (×2): 30 mg via INTRAVENOUS
  Filled 2015-07-07 (×6): qty 1

## 2015-07-07 MED ORDER — DEXTROSE 5 % IV SOLN
2.0000 g | Freq: Two times a day (BID) | INTRAVENOUS | Status: AC
  Administered 2015-07-07: 2 g via INTRAVENOUS
  Filled 2015-07-07: qty 2

## 2015-07-07 MED ORDER — SODIUM CHLORIDE 0.9 % IV BOLUS (SEPSIS)
1000.0000 mL | Freq: Once | INTRAVENOUS | Status: AC
  Administered 2015-07-07: 1000 mL via INTRAVENOUS

## 2015-07-07 MED ORDER — HYDROMORPHONE HCL 1 MG/ML IJ SOLN
INTRAMUSCULAR | Status: AC
  Filled 2015-07-07: qty 1

## 2015-07-07 MED ORDER — FENTANYL CITRATE (PF) 100 MCG/2ML IJ SOLN
25.0000 ug | INTRAMUSCULAR | Status: DC | PRN
  Administered 2015-07-07 (×3): 50 ug via INTRAVENOUS

## 2015-07-07 MED ORDER — ACETAMINOPHEN 10 MG/ML IV SOLN
INTRAVENOUS | Status: AC
  Filled 2015-07-07: qty 100

## 2015-07-07 MED ORDER — PROPOFOL 10 MG/ML IV BOLUS
INTRAVENOUS | Status: DC | PRN
  Administered 2015-07-07: 50 mg via INTRAVENOUS
  Administered 2015-07-07: 150 mg via INTRAVENOUS

## 2015-07-07 MED ORDER — BUPIVACAINE HCL (PF) 0.5 % IJ SOLN
INTRAMUSCULAR | Status: AC
  Filled 2015-07-07: qty 30

## 2015-07-07 MED ORDER — BUPIVACAINE HCL (PF) 0.5 % IJ SOLN
INTRAMUSCULAR | Status: DC | PRN
  Administered 2015-07-07: 15 mL

## 2015-07-07 MED ORDER — ONDANSETRON HCL 4 MG PO TABS: 4.0000 mg | ORAL_TABLET | Freq: Four times a day (QID) | ORAL | Status: DC | PRN

## 2015-07-07 MED ORDER — ONDANSETRON HCL 4 MG/2ML IJ SOLN: INTRAMUSCULAR | Status: DC | PRN

## 2015-07-07 MED ORDER — ACETAMINOPHEN 10 MG/ML IV SOLN
1000.0000 mg | Freq: Four times a day (QID) | INTRAVENOUS | Status: DC
  Administered 2015-07-07: 1000 mg via INTRAVENOUS

## 2015-07-07 MED ORDER — METHOCARBAMOL 1000 MG/10ML IJ SOLN
500.0000 mg | Freq: Four times a day (QID) | INTRAVENOUS | Status: DC | PRN
  Administered 2015-07-07 – 2015-07-09 (×5): 500 mg via INTRAVENOUS
  Filled 2015-07-07 (×11): qty 5

## 2015-07-07 MED ORDER — HEPARIN SODIUM (PORCINE) 5000 UNIT/ML IJ SOLN
5000.0000 [IU] | Freq: Three times a day (TID) | INTRAMUSCULAR | Status: DC
  Administered 2015-07-08 – 2015-07-12 (×13): 5000 [IU] via SUBCUTANEOUS
  Filled 2015-07-07 (×17): qty 1

## 2015-07-07 MED ORDER — PHENYLEPHRINE 40 MCG/ML (10ML) SYRINGE FOR IV PUSH (FOR BLOOD PRESSURE SUPPORT)
PREFILLED_SYRINGE | INTRAVENOUS | Status: AC
  Filled 2015-07-07: qty 10

## 2015-07-07 MED ORDER — DEXAMETHASONE SODIUM PHOSPHATE 10 MG/ML IJ SOLN
INTRAMUSCULAR | Status: DC | PRN
  Administered 2015-07-07: 10 mg via INTRAVENOUS

## 2015-07-07 MED ORDER — HYDROMORPHONE HCL 1 MG/ML IJ SOLN
INTRAMUSCULAR | Status: DC | PRN
  Administered 2015-07-07: .4 mg via INTRAVENOUS
  Administered 2015-07-07: .2 mg via INTRAVENOUS
  Administered 2015-07-07: .6 mg via INTRAVENOUS

## 2015-07-07 MED ORDER — ROCURONIUM BROMIDE 100 MG/10ML IV SOLN
INTRAVENOUS | Status: DC | PRN
  Administered 2015-07-07: 10 mg via INTRAVENOUS
  Administered 2015-07-07 (×4): 20 mg via INTRAVENOUS
  Administered 2015-07-07 (×2): 10 mg via INTRAVENOUS
  Administered 2015-07-07 (×2): 20 mg via INTRAVENOUS
  Administered 2015-07-07: 40 mg via INTRAVENOUS
  Administered 2015-07-07: 20 mg via INTRAVENOUS

## 2015-07-07 MED ORDER — LIDOCAINE HCL (CARDIAC) 20 MG/ML IV SOLN
INTRAVENOUS | Status: DC | PRN
  Administered 2015-07-07: 50 mg via INTRAVENOUS

## 2015-07-07 SURGICAL SUPPLY — 74 items
APPLIER CLIP 5 13 M/L LIGAMAX5 (MISCELLANEOUS)
APPLIER CLIP ROT 10 11.4 M/L (STAPLE)
BLADE EXTENDED COATED 6.5IN (ELECTRODE) ×4 IMPLANT
CABLE HIGH FREQUENCY MONO STRZ (ELECTRODE) ×4 IMPLANT
CELLS DAT CNTRL 66122 CELL SVR (MISCELLANEOUS) IMPLANT
CHLORAPREP W/TINT 26ML (MISCELLANEOUS) ×4 IMPLANT
CLIP APPLIE 5 13 M/L LIGAMAX5 (MISCELLANEOUS) IMPLANT
CLIP APPLIE ROT 10 11.4 M/L (STAPLE) IMPLANT
COUNTER NEEDLE 20 DBL MAG RED (NEEDLE) ×4 IMPLANT
COVER MAYO STAND STRL (DRAPES) ×12 IMPLANT
COVER SURGICAL LIGHT HANDLE (MISCELLANEOUS) ×8 IMPLANT
DECANTER SPIKE VIAL GLASS SM (MISCELLANEOUS) IMPLANT
DISSECTOR BLUNT TIP ENDO 5MM (MISCELLANEOUS) ×4 IMPLANT
DRAIN CHANNEL 19F RND (DRAIN) IMPLANT
DRAPE LAPAROSCOPIC ABDOMINAL (DRAPES) ×4 IMPLANT
DRAPE SHEET LG 3/4 BI-LAMINATE (DRAPES) ×4 IMPLANT
DRAPE SURG IRRIG POUCH 19X23 (DRAPES) ×4 IMPLANT
DRSG OPSITE POSTOP 4X10 (GAUZE/BANDAGES/DRESSINGS) IMPLANT
DRSG OPSITE POSTOP 4X6 (GAUZE/BANDAGES/DRESSINGS) IMPLANT
DRSG OPSITE POSTOP 4X8 (GAUZE/BANDAGES/DRESSINGS) IMPLANT
DRSG TEGADERM 2-3/8X2-3/4 SM (GAUZE/BANDAGES/DRESSINGS) IMPLANT
DRSG TEGADERM 4X4.75 (GAUZE/BANDAGES/DRESSINGS) ×4 IMPLANT
ELECT PENCIL ROCKER SW 15FT (MISCELLANEOUS) ×8 IMPLANT
EVACUATOR SILICONE 100CC (DRAIN) IMPLANT
FILTER SMOKE EVAC LAPAROSHD (FILTER) IMPLANT
GAUZE SPONGE 2X2 8PLY STRL LF (GAUZE/BANDAGES/DRESSINGS) IMPLANT
GAUZE SPONGE 4X4 12PLY STRL (GAUZE/BANDAGES/DRESSINGS) ×4 IMPLANT
GAUZE SPONGE 4X4 16PLY XRAY LF (GAUZE/BANDAGES/DRESSINGS) ×4 IMPLANT
GLOVE ECLIPSE 8.0 STRL XLNG CF (GLOVE) ×8 IMPLANT
GLOVE INDICATOR 8.0 STRL GRN (GLOVE) ×8 IMPLANT
GOWN STRL REUS W/TWL XL LVL3 (GOWN DISPOSABLE) ×24 IMPLANT
LEGGING LITHOTOMY PAIR STRL (DRAPES) ×4 IMPLANT
LIGASURE IMPACT 36 18CM CVD LR (INSTRUMENTS) IMPLANT
LUBRICANT JELLY K Y 4OZ (MISCELLANEOUS) IMPLANT
MARKER SKIN DUAL TIP RULER LAB (MISCELLANEOUS) ×4 IMPLANT
PACK COLON (CUSTOM PROCEDURE TRAY) ×4 IMPLANT
PAD POSITIONING PINK XL (MISCELLANEOUS) ×4 IMPLANT
RELOAD PROXIMATE 75MM BLUE (ENDOMECHANICALS) ×4 IMPLANT
RTRCTR WOUND ALEXIS 18CM MED (MISCELLANEOUS)
SCISSORS LAP 5X35 DISP (ENDOMECHANICALS) ×4 IMPLANT
SCISSORS LAP 5X45 EPIX DISP (ENDOMECHANICALS) ×4 IMPLANT
SET IRRIG TUBING LAPAROSCOPIC (IRRIGATION / IRRIGATOR) ×4 IMPLANT
SHEARS CURVED HARMONIC AC 45CM (MISCELLANEOUS) ×4 IMPLANT
SHEARS HARMONIC ACE PLUS 36CM (ENDOMECHANICALS) IMPLANT
SLEEVE XCEL OPT CAN 5 100 (ENDOMECHANICALS) ×16 IMPLANT
SPONGE GAUZE 2X2 STER 10/PKG (GAUZE/BANDAGES/DRESSINGS)
SPONGE LAP 18X18 X RAY DECT (DISPOSABLE) ×12 IMPLANT
STAPLER CIRC CVD 29MM 37CM (STAPLE) ×4 IMPLANT
STAPLER PROXIMATE 75MM BLUE (STAPLE) ×4 IMPLANT
STAPLER VISISTAT 35W (STAPLE) ×4 IMPLANT
SUCTION POOLE TIP (SUCTIONS) ×4 IMPLANT
SUT ETHILON 3 0 PS 1 (SUTURE) IMPLANT
SUT PDS AB 1 CTX 36 (SUTURE) IMPLANT
SUT PDS AB 1 TP1 96 (SUTURE) ×16 IMPLANT
SUT PROLENE 2 0 SH DA (SUTURE) ×4 IMPLANT
SUT SILK 2 0 (SUTURE) ×2
SUT SILK 2 0 SH CR/8 (SUTURE) ×4 IMPLANT
SUT SILK 2-0 18XBRD TIE 12 (SUTURE) ×2 IMPLANT
SUT SILK 3 0 (SUTURE) ×2
SUT SILK 3 0 SH CR/8 (SUTURE) ×8 IMPLANT
SUT SILK 3-0 18XBRD TIE 12 (SUTURE) ×2 IMPLANT
SUT VICRYL 0 UR6 27IN ABS (SUTURE) ×4 IMPLANT
SYS LAPSCP GELPORT 120MM (MISCELLANEOUS) ×4
SYSTEM LAPSCP GELPORT 120MM (MISCELLANEOUS) ×2 IMPLANT
TAPE PAPER 3X10 WHT MICROPORE (GAUZE/BANDAGES/DRESSINGS) ×4 IMPLANT
TOWEL OR 17X26 10 PK STRL BLUE (TOWEL DISPOSABLE) ×4 IMPLANT
TOWEL OR NON WOVEN STRL DISP B (DISPOSABLE) ×8 IMPLANT
TRAY FOLEY W/METER SILVER 14FR (SET/KITS/TRAYS/PACK) ×4 IMPLANT
TRAY FOLEY W/METER SILVER 16FR (SET/KITS/TRAYS/PACK) IMPLANT
TROCAR BLADELESS OPT 5 100 (ENDOMECHANICALS) ×4 IMPLANT
TROCAR XCEL BLUNT TIP 100MML (ENDOMECHANICALS) ×4 IMPLANT
TROCAR XCEL NON-BLD 11X100MML (ENDOMECHANICALS) IMPLANT
TUBING CONNECTING 10 (TUBING) ×3 IMPLANT
TUBING CONNECTING 10' (TUBING) ×1

## 2015-07-07 NOTE — Transfer of Care (Signed)
Immediate Anesthesia Transfer of Care Note  Patient: Carla Herrera  Procedure(s) Performed: Procedure(s): LAPAROSCOPIC LYSYS OF ADHESIONS, MOBILIZATION OF SPLENIC FLEXURE, SEGMENTAL COLECTOMY, REMOVAL OF PERITONEAL CYST, COLOSTOMY CLOSURE (N/A) OOPHORECTOMY (Right)  Patient Location: PACU  Anesthesia Type:General  Level of Consciousness: alert , sedated and patient cooperative  Airway & Oxygen Therapy: Patient connected to face mask oxygen  Post-op Assessment: Post -op Vital signs reviewed and stable  Post vital signs: stable  Last Vitals:  Filed Vitals:   07/07/15 0512  BP: 125/65  Pulse: 69  Temp: 36.6 C  Resp: 18    Complications: No apparent anesthesia complications

## 2015-07-07 NOTE — Anesthesia Postprocedure Evaluation (Signed)
  Anesthesia Post-op Note  Patient: Carla Herrera  Procedure(s) Performed: Procedure(s) (LRB): LAPAROSCOPIC LYSYS OF ADHESIONS, MOBILIZATION OF SPLENIC FLEXURE, SEGMENTAL COLECTOMY, REMOVAL OF PERITONEAL CYST, COLOSTOMY CLOSURE (N/A) OOPHORECTOMY (Right)  Patient Location: PACU  Anesthesia Type: General  Level of Consciousness: awake and alert   Airway and Oxygen Therapy: Patient Spontanous Breathing  Post-op Pain: mild  Post-op Assessment: Post-op Vital signs reviewed, Patient's Cardiovascular Status Stable, Respiratory Function Stable, Patent Airway and No signs of Nausea or vomiting  Last Vitals:  Filed Vitals:   07/07/15 1819  BP: 138/63  Pulse: 90  Temp: 36.9 C  Resp: 13    Post-op Vital Signs: stable   Complications: No apparent anesthesia complications

## 2015-07-07 NOTE — Anesthesia Procedure Notes (Signed)
Procedure Name: Intubation Date/Time: 07/07/2015 7:41 AM Performed by: Carla Herrera, Carla Herrera H Pre-anesthesia Checklist: Patient identified, Emergency Drugs available, Suction available, Patient being monitored and Timeout performed Patient Re-evaluated:Patient Re-evaluated prior to inductionOxygen Delivery Method: Circle system utilized Preoxygenation: Pre-oxygenation with 100% oxygen Intubation Type: IV induction Ventilation: Mask ventilation without difficulty Laryngoscope Size: Miller, 3 and 2 Tube type: Oral Tube size: 7.5 mm Number of attempts: 1 Airway Equipment and Method: Stylet Placement Confirmation: positive ETCO2,  ETT inserted through vocal cords under direct vision,  CO2 detector and breath sounds checked- equal and bilateral Secured at: 23 cm Tube secured with: Tape Dental Injury: Teeth and Oropharynx as per pre-operative assessment  Difficulty Due To: Difficult Airway- due to anterior larynx

## 2015-07-07 NOTE — Progress Notes (Signed)
Call to Dr. Corliss Skainssuei on call for [facility] to update on patient's pain level 8-9 with Dilaudid PCA. Order for one time bolus of 1 mg dilaudid through PCA received as well as order for robaxin 500 mg IV prn Q6H.

## 2015-07-07 NOTE — Progress Notes (Signed)
Call to Dr. Abbey Chattersosenbower to update on pt's pain level. Order to change MS PCA to full dose Dilaudid PCA received.

## 2015-07-07 NOTE — Interval H&P Note (Signed)
History and Physical Interval Note:  07/07/2015 7:26 AM  Carla Herrera  has presented today for surgery, with the diagnosis of colostomy in place  The various methods of treatment have been discussed with the patient and family. After consideration of risks, benefits and other options for treatment, the patient has consented to  Procedure(s): LAPAROSCOPIC COLOSTOMY CLOSURE (N/A) as a surgical intervention .  The patient's history has been reviewed, patient examined, no change in status, stable for surgery.  I have reviewed the patient's chart and labs.  Questions were answered to the patient's satisfaction.     Jaelah Hauth JShela Commons

## 2015-07-07 NOTE — Anesthesia Preprocedure Evaluation (Addendum)
Anesthesia Evaluation  Patient identified by MRN, date of birth, ID band Patient awake    Reviewed: Allergy & Precautions, NPO status , Patient's Chart, lab work & pertinent test results  History of Anesthesia Complications Negative for: history of anesthetic complications  Airway Mallampati: II  TM Distance: >3 FB Neck ROM: Full    Dental  (+) Teeth Intact, Dental Advisory Given, Chipped,    Pulmonary former smoker,    Pulmonary exam normal breath sounds clear to auscultation       Cardiovascular Exercise Tolerance: Good negative cardio ROS Normal cardiovascular exam Rhythm:Regular Rate:Normal     Neuro/Psych PSYCHIATRIC DISORDERS Depression negative neurological ROS     GI/Hepatic negative GI ROS, Neg liver ROS,   Endo/Other  Obesity   Renal/GU negative Renal ROS     Musculoskeletal negative musculoskeletal ROS (+)   Abdominal   Peds  Hematology negative hematology ROS (+)   Anesthesia Other Findings Day of surgery medications reviewed with the patient.  Reproductive/Obstetrics                           Anesthesia Physical Anesthesia Plan  ASA: II  Anesthesia Plan: General   Post-op Pain Management:    Induction: Intravenous  Airway Management Planned: Oral ETT  Additional Equipment:   Intra-op Plan:   Post-operative Plan: Extubation in OR  Informed Consent: I have reviewed the patients History and Physical, chart, labs and discussed the procedure including the risks, benefits and alternatives for the proposed anesthesia with the patient or authorized representative who has indicated his/her understanding and acceptance.   Dental advisory given  Plan Discussed with: CRNA  Anesthesia Plan Comments: (Risks/benefits of general anesthesia discussed with patient including risk of damage to teeth, lips, gum, and tongue, nausea/vomiting, allergic reactions to medications, and  the possibility of heart attack, stroke and death.  All patient questions answered.  Patient wishes to proceed.)        Anesthesia Quick Evaluation

## 2015-07-07 NOTE — Op Note (Signed)
Operative Note  Annia BeltDeidre Rueger female 49 y.o. 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         Surgeon: Adolph PollackOSENBOWER,Caral Whan J   Assistants: Marcille BlancoMatt Tsuei, MD; Harden MoMatt Wakefield, MD  Anesthesia: General endotracheal anesthesia  Indications:   This is a 49 year old female who underwent a urgent Hartmann procedure for complicated diverticulitis in June of this year. She has recovered fully. She now presents for colostomy closure.    Procedure Detail:  She was brought to the operating room and placed supine on the operating table and a general anesthetic was given. She was placed in the lithotomy position. A Foley catheter was inserted. An oral gastric tube was inserted. The colostomy appliance was removed and the area around the colostomy cleaned. The abdominal wall and then perineal areas were sterilely prepped and draped. A Betadine soaked sponges placed on the colostomy followed by an occlusive clear dressing.  An incision was made through a small supraumbilical scar and carried down to the fascia. An incision was made in the midline anterior and posterior fascia and the peritoneal cavity entered under direct vision. A pursestring suture of 0 Vicryl is placed around the edges of the fascia. A Hassan trocar was introduced into the peritoneal cavity and a pneumoperitoneum was created by insufflation of CO2 gas. The laparoscope was introduced and there is no evidence of underlying organ injury or bleeding. A 5 mm trocar was placed in the right lower quadrant. A 5 mm trocar was placed in the right upper quadrant. A 5 mm trocar was placed in the left lower quadrant lateral to the colostomy. There were adhesions noted between the omentum and anterior abdominal wall as well as small bowel and  anterior abdominal wall. There were adhesions between the cecum and abdominal wall in the midline as well. Using sharp dissection with occasional Harmonic scalpel dissection, I began by lysing the extensive adhesions. I was able to free up the small bowel from the abdominal wall underneath the previous midline scar. I then began mobilizing adhesions from the colon just proximal to the colostomy. The splenic flexure was then mobilized allowing for increased mobility of the remaining descending colon. I began lysing adhesions between the small bowel and colostomy site as well. I could not definitely identify the rectal stump laparoscopically. I subsequently went through the old lower midline scar and divided the skin and all layers entering the peritoneal cavity. There were no adhesions between the cecum and uterus which I mobilize. A peritoneal cyst was noted and just inferior to the cyst the suture was identified along with the rectal stump staple line. I removed the peritoneal cyst. I dissected the left ovary away from the rectum by dividing adhesions. The right ovary however was densely adherent to the rectum and could not be easily separated. I did a right oophorectomy just to remove it from the anterior rectal area allowing me to mobilize part of the rectum. The rectum was somewhat curvilinear near the stump and I was able to straighten this out. I further divided adhesions between the descending colon lateral wall and further mobilize this.  The colostomy was approached and a circular incision made around it. The subcutaneous tissue was dissected free down to the level of the fascia. I worked from both intraperitoneal and extraperitoneal to free up the colostomy and allow  it to be brought into the peritoneal cavity. There are 2 small serosal separations which were reapproximated with interrupted 3-0 silk suture.  A size 29 EEA anvil was then placed into the descending colon. It was brought out the side of  the colon through tinea and then anchored tear with a 2-0 Prolene suture. A segment of colon including the colostomy was then removed using the linear cutting stapler.  Following this, an EEA sizer was placed in the anus and able to come up to the staple line of the rectum. The handle of the stapler was then placed into the anus and a anterior wall rectum to side descending colon anastomosis was performed with the EEA stapler. 2 complete donuts were noted. There is a small serosal separation on the anterior aspect of the right which was closed with Lembert type sutures. The air leak test was performed and there is no evidence of leak.  The abdominal cavity was then irrigated with a liter of saline solution. Gloves and gowns as well as instruments were changed. The fascia at the site of the previous colostomy was then closed with running #1 double-stranded PDS suture. The lower midline wound fascia was then closed with running double-stranded #1 PDS suture. Prior to closure of the lower midline, a 19 Blake drain was placed through a right lower quadrant port site and positioned into the pelvis. This was anchored to the skin with 3-0 nylon suture.   Repeat laparoscopy was performed. 4 quadrant inspection demonstrated no evidence of organ injury or bleeding. Fascial closure of the lower midline area was solid. The remaining trocars were removed.  The subcutaneous tissue of the lower midline incision was irrigated. The skin of each remaining trocar site was closed with staples. A lower midline incision was closed with staples. The colostomy site wound was packed with saline moistened gauze. Dry dressings were applied. The drain was hooked up to bulb suction.  She tolerated the procedure well without any apparent complications and was taken to the recovery room in satisfactory condition. Estimated blood loss is between 250-300 cc.

## 2015-07-07 NOTE — H&P (Signed)
Carla Herrera is an 49 y.o. female.   Chief Complaint:  Here for elective surgery HPI: She underwent an urgent Hartmann procedure for complicated sigmoid diverticulitis in June of this year.  She has fully recovered and now presents for colostomy closure.  She tolerated the bowel prep  Past Medical History  Diagnosis Date  . Depression   . Hypothyroidism     hx of   . Anemia     as a child   . Diverticulitis     Past Surgical History  Procedure Laterality Date  . Laparoscopic partial colectomy N/A 02/17/2015    open colectomy wtih colostomy   . Uterine ablation  2010    for heavy menstraul bleeding   . Tonsillectomy    . Cholecystectomy    . Appendectomy    . Tubal ligation      Family History  Problem Relation Age of Onset  . COPD Mother   . Hypertension Mother   . Diabetes Mother   . Diabetes Father   . Cancer Neg Hx    Social History:  reports that she quit smoking about 4 months ago. She has never used smokeless tobacco. She reports that she drinks alcohol. She reports that she does not use illicit drugs.  Allergies:  Allergies  Allergen Reactions  . Meperidine Hcl Other (See Comments)    convulsions  . Tape Other (See Comments)    Tears skin; requires paper tape  . Ciprofloxacin Rash    Medications Prior to Admission  Medication Sig Dispense Refill  . calcium carbonate (OSCAL) 1500 (600 CA) MG TABS tablet Take 600 mg of elemental calcium by mouth daily.    . citalopram (CELEXA) 40 MG tablet Take 1 tablet (40 mg total) by mouth daily. 30 tablet 2  . ergocalciferol (VITAMIN D2) 50000 UNITS capsule Take 50,000 Units by mouth every Sunday.    . methocarbamol (ROBAXIN) 750 MG tablet Take 1 tablet (750 mg total) by mouth every 8 (eight) hours as needed for muscle spasms. 90 tablet 3  . Prenatal Vit-Fe Fumarate-FA (PRENATAL MULTIVITAMIN) TABS tablet Take 1 tablet by mouth daily at 12 noon.      No results found for this or any previous visit (from the past 48  hour(s)). No results found.  Review of Systems  Constitutional: Negative for fever and chills.    Blood pressure 125/65, pulse 69, temperature 97.9 F (36.6 C), temperature source Oral, resp. rate 18, height 5\' 6"  (1.676 m), weight 107.502 kg (237 lb), last menstrual period 06/29/2015, SpO2 97 %. Physical Exam  Constitutional:  Overweight female in NAD.  HENT:  Head: Normocephalic and atraumatic.  Cardiovascular: Normal rate and regular rhythm.   Respiratory: Breath sounds normal.  GI: Soft.  Lower midline scar, left sided colostomy.  Small right sided scar and supraumbilical scar.  Musculoskeletal: She exhibits no edema.  Neurological: She is alert.  Skin: Skin is warm and dry.  Psychiatric: She has a normal mood and affect. Her behavior is normal.     Assessment/Plan [facility]  Plan:  Laparoscopic assisted colostomy closure.  Procedure and risks have been discussed with her.  Chirag Krueger J 07/07/2015, 7:24 AM

## 2015-07-07 NOTE — Brief Op Note (Signed)
07/07/2015  12:27 PM  PATIENT:  Christabelle Erick Alleyawlins  49 y.o. female  PRE-OPERATIVE DIAGNOSIS:  colostomy in place  POST-OPERATIVE DIAGNOSIS:  colostomy in place   PROCEDURE:  Procedure(s): LAPAROSCOPIC LYSYS OF ADHESIONS, MOBILIZATION OF SPLENIC FLEXURE, SEGMENTAL COLECTOMY, REMOVAL OF PERITONEAL CYST, COLOSTOMY CLOSURE (N/A) OOPHORECTOMY (Right)  SURGEON:  Surgeon(s) and Role:    * Avel Peaceodd Brendalee Matthies, MD - Primary    * Emelia LoronMatthew Wakefield, MD - Assisting    * Manus RuddMatthew Tsuei, MD - Assisting    * Manus RuddMatthew Tsuei, MD - Assisting  ANESTHESIA:   general  EBL:  Total I/O In: 2000 [I.V.:2000] Out: 510 [Urine:260; Blood:250]   DRAINS: #19 blake drain in pelvis   LOCAL MEDICATIONS USED:  MARCAINE     SPECIMEN:  Source of Specimen:  colon, right ovary, peritoneal cyst  DISPOSITION OF SPECIMEN:  PATHOLOGY  COUNTS:  YES  TOURNIQUET:  * No tourniquets in log *  DICTATION: .Dragon Dictation  PLAN OF CARE: Admit to inpatient   PATIENT DISPOSITION:  PACU - hemodynamically stable.   Delay start of Pharmacological VTE agent (>24hrs) due to surgical blood loss or risk of bleeding: no

## 2015-07-08 LAB — BASIC METABOLIC PANEL
Anion gap: 5 (ref 5–15)
BUN: 11 mg/dL (ref 6–20)
CALCIUM: 8.2 mg/dL — AB (ref 8.9–10.3)
CO2: 25 mmol/L (ref 22–32)
CREATININE: 0.82 mg/dL (ref 0.44–1.00)
Chloride: 106 mmol/L (ref 101–111)
Glucose, Bld: 122 mg/dL — ABNORMAL HIGH (ref 65–99)
Potassium: 4.4 mmol/L (ref 3.5–5.1)
SODIUM: 136 mmol/L (ref 135–145)

## 2015-07-08 LAB — CBC
HCT: 36.3 % (ref 36.0–46.0)
Hemoglobin: 11.5 g/dL — ABNORMAL LOW (ref 12.0–15.0)
MCH: 30 pg (ref 26.0–34.0)
MCHC: 31.7 g/dL (ref 30.0–36.0)
MCV: 94.8 fL (ref 78.0–100.0)
PLATELETS: 213 10*3/uL (ref 150–400)
RBC: 3.83 MIL/uL — AB (ref 3.87–5.11)
RDW: 13.5 % (ref 11.5–15.5)
WBC: 17.7 10*3/uL — AB (ref 4.0–10.5)

## 2015-07-08 LAB — BLOOD GAS, ARTERIAL
ACID-BASE DEFICIT: 1.2 mmol/L (ref 0.0–2.0)
Bicarbonate: 23.5 mEq/L (ref 20.0–24.0)
DRAWN BY: 345601
O2 CONTENT: 3 L/min
O2 Saturation: 97.7 %
PATIENT TEMPERATURE: 98.6
TCO2: 21.4 mmol/L (ref 0–100)
pCO2 arterial: 41.6 mmHg (ref 35.0–45.0)
pH, Arterial: 7.37 (ref 7.350–7.450)
pO2, Arterial: 98.9 mmHg (ref 80.0–100.0)

## 2015-07-08 MED ORDER — FENTANYL CITRATE (PF) 100 MCG/2ML IJ SOLN
12.5000 ug | INTRAMUSCULAR | Status: DC | PRN
  Administered 2015-07-08 (×3): 25 ug via INTRAVENOUS
  Administered 2015-07-08: 12.5 ug via INTRAVENOUS
  Administered 2015-07-08 – 2015-07-11 (×23): 25 ug via INTRAVENOUS
  Filled 2015-07-08 (×27): qty 2

## 2015-07-08 MED ORDER — HALOPERIDOL LACTATE 5 MG/ML IJ SOLN
2.5000 mg | Freq: Four times a day (QID) | INTRAMUSCULAR | Status: DC | PRN
  Administered 2015-07-08: 2.5 mg via INTRAVENOUS
  Filled 2015-07-08: qty 1

## 2015-07-08 MED ORDER — LORAZEPAM 2 MG/ML IJ SOLN
INTRAMUSCULAR | Status: AC
  Filled 2015-07-08: qty 1

## 2015-07-08 MED ORDER — ACETAMINOPHEN 10 MG/ML IV SOLN
1000.0000 mg | Freq: Four times a day (QID) | INTRAVENOUS | Status: AC
  Administered 2015-07-08 – 2015-07-09 (×4): 1000 mg via INTRAVENOUS
  Filled 2015-07-08 (×4): qty 100

## 2015-07-08 NOTE — Progress Notes (Signed)
1 Day Post-Op  Subjective: Pain control an issue all night long.  Feels better this AM.  Had a reaction to second dose of Toradol in which she had some "jerking motions."  No nausea.  We discussed the surgery.  Objective: Vital signs in last 24 hours: Temp:  [97.5 F (36.4 C)-98.5 F (36.9 C)] 98.4 F (36.9 C) (11/11 0610) Pulse Rate:  [69-108] 83 (11/11 0610) Resp:  [6-17] 10 (11/11 0610) BP: (103-138)/(53-81) 107/57 mmHg (11/11 0610) SpO2:  [91 %-100 %] 93 % (11/11 0610) Last BM Date: 07/07/15  Intake/Output from previous day: 11/10 0701 - 11/11 0700 In: 4672.1 [P.O.:40; I.V.:4427.1; IV Piggyback:205] Out: 1020 [Urine:770; Blood:250] Intake/Output this shift:    PE: General- In NAD Abdomen-soft, some drainage on colostomy site dressing, minimal sanguinous drain output, hypoactive bowel sounds.  Lab Results:   Recent Labs  07/08/15 0437  WBC 17.7*  HGB 11.5*  HCT 36.3  PLT 213   BMET  Recent Labs  07/08/15 0437  NA 136  K 4.4  CL 106  CO2 25  GLUCOSE 122*  BUN 11  CREATININE 0.82  CALCIUM 8.2*   PT/INR No results for input(s): LABPROT, INR in the last 72 hours. Comprehensive Metabolic Panel:    Component Value Date/Time   NA 136 07/08/2015 0437   NA 139 06/30/2015 0910   K 4.4 07/08/2015 0437   K 4.1 06/30/2015 0910   CL 106 07/08/2015 0437   CL 105 06/30/2015 0910   CO2 25 07/08/2015 0437   CO2 28 06/30/2015 0910   BUN 11 07/08/2015 0437   BUN 10 06/30/2015 0910   CREATININE 0.82 07/08/2015 0437   CREATININE 0.79 06/30/2015 0910   GLUCOSE 122* 07/08/2015 0437   GLUCOSE 93 06/30/2015 0910   CALCIUM 8.2* 07/08/2015 0437   CALCIUM 9.2 06/30/2015 0910   AST 22 06/30/2015 0910   ALT 20 06/30/2015 0910   ALKPHOS 71 06/30/2015 0910   BILITOT 0.3 06/30/2015 0910   PROT 7.4 06/30/2015 0910   ALBUMIN 4.0 06/30/2015 0910     Studies/Results: No results found.  Anti-infectives: Anti-infectives    Start     Dose/Rate Route Frequency Ordered  Stop   07/07/15 1800  cefoTEtan (CEFOTAN) 2 g in dextrose 5 % 50 mL IVPB     2 g 100 mL/hr over 30 Minutes Intravenous Every 12 hours 07/07/15 1444 07/07/15 1816   07/07/15 0512  cefoTEtan in Dextrose 5% (CEFOTAN) IVPB 2 g     2 g Intravenous On call to O.R. 07/07/15 0512 07/07/15 0820      Assessment Active Problems:   Colostomy in place [facility](HCC) s/p laparoscopic assisted closure 07/07/15-difficulty with pain control, better this AM    LOS: 1 day   Plan: Try clear liquid diet.  Stop Toradol.   Carla Herrera J 07/08/2015

## 2015-07-08 NOTE — Progress Notes (Signed)
Rapid response called at 0345 after pt started having unusual behavior just after Toradol was given.  Pt started having involuntary muscle movement especially with shoulders.  Pt began having anxiety attack when this happened and was screaming "I can't breathe".  Oxygen saturation was 98, tried to calm pt but involuntary muscle movements became constant and pt became more hysterical.  Pt had been having decreasing respiratory rate all shift thus PCA was on pause.  Respiration frequently 5. Rapid response came, orders placed for ABG.  AM labs obtained early, MD called and orders for Haldol received.  Gave 2.5mg  of Haldol and pt calmed down and involuntary muscle movements stopped.  Pt resting quietly presently. Will continue to monitor and keep on pulse ox continuously. Barnett HatterKallam, Nussen Pullin P

## 2015-07-09 MED ORDER — ACETAMINOPHEN 325 MG PO TABS
650.0000 mg | ORAL_TABLET | ORAL | Status: DC | PRN
  Administered 2015-07-09 – 2015-07-11 (×3): 650 mg via ORAL
  Filled 2015-07-09 (×3): qty 2

## 2015-07-09 NOTE — Progress Notes (Signed)
Dr. Daphine DeutscherMartin aware via phone pt's pain remains in moderate to severe range even with 25 mcg of Fentanyl every hour. See new orders received.

## 2015-07-09 NOTE — Progress Notes (Signed)
2 Days Post-Op  Subjective: Pain control much better.  Drank some water yesterday.  Voiding. No nausea.  No flatus or BM.  Walking. Objective: Vital signs in last 24 hours: Temp:  [98.5 F (36.9 C)-98.9 F (37.2 C)] 98.6 F (37 C) (11/12 0500) Pulse Rate:  [70-83] 82 (11/12 0500) Resp:  [18-22] 18 (11/12 0500) BP: (105-127)/(51-65) 126/65 mmHg (11/12 0500) SpO2:  [93 %-100 %] 96 % (11/12 0500) Last BM Date: 07/08/15  Intake/Output from previous day: 11/11 0701 - 11/12 0700 In: 3100 [I.V.:3000; IV Piggyback:100] Out: 1005 [Urine:1000; Drains:5] Intake/Output this shift:    PE: General- In NAD Abdomen-soft, lower midline wound is clean and intact, colostomy site wound open and clean, hypoactive bowel sounds.  Lab Results:   Recent Labs  07/08/15 0437  WBC 17.7*  HGB 11.5*  HCT 36.3  PLT 213   BMET  Recent Labs  07/08/15 0437  NA 136  K 4.4  CL 106  CO2 25  GLUCOSE 122*  BUN 11  CREATININE 0.82  CALCIUM 8.2*   PT/INR No results for input(s): LABPROT, INR in the last 72 hours. Comprehensive Metabolic Panel:    Component Value Date/Time   NA 136 07/08/2015 0437   NA 139 06/30/2015 0910   K 4.4 07/08/2015 0437   K 4.1 06/30/2015 0910   CL 106 07/08/2015 0437   CL 105 06/30/2015 0910   CO2 25 07/08/2015 0437   CO2 28 06/30/2015 0910   BUN 11 07/08/2015 0437   BUN 10 06/30/2015 0910   CREATININE 0.82 07/08/2015 0437   CREATININE 0.79 06/30/2015 0910   GLUCOSE 122* 07/08/2015 0437   GLUCOSE 93 06/30/2015 0910   CALCIUM 8.2* 07/08/2015 0437   CALCIUM 9.2 06/30/2015 0910   AST 22 06/30/2015 0910   ALT 20 06/30/2015 0910   ALKPHOS 71 06/30/2015 0910   BILITOT 0.3 06/30/2015 0910   PROT 7.4 06/30/2015 0910   ALBUMIN 4.0 06/30/2015 0910     Studies/Results: No results found.  Anti-infectives: Anti-infectives    Start     Dose/Rate Route Frequency Ordered Stop   07/07/15 1800  cefoTEtan (CEFOTAN) 2 g in dextrose 5 % 50 mL IVPB     2 g 100 mL/hr  over 30 Minutes Intravenous Every 12 hours 07/07/15 1444 07/07/15 1816   07/07/15 0512  cefoTEtan in Dextrose 5% (CEFOTAN) IVPB 2 g     2 g Intravenous On call to O.R. 07/07/15 0512 07/07/15 0820      Assessment Active Problems:   Colostomy in place [facility](HCC) s/p laparoscopic assisted closure 07/07/15-Feeling better overall.  No bowel function yet.    LOS: 2 days   Plan: Full liquids.  Decrease IVF.   Verlean Allport J 07/09/2015

## 2015-07-10 MED ORDER — OXYCODONE HCL 5 MG PO TABS
5.0000 mg | ORAL_TABLET | ORAL | Status: DC | PRN
Start: 2015-07-10 — End: 2015-07-12
  Administered 2015-07-10 – 2015-07-12 (×8): 10 mg via ORAL
  Filled 2015-07-10 (×8): qty 2

## 2015-07-10 MED ORDER — PANTOPRAZOLE SODIUM 40 MG PO TBEC
40.0000 mg | DELAYED_RELEASE_TABLET | Freq: Every day | ORAL | Status: DC
  Administered 2015-07-10 – 2015-07-11 (×2): 40 mg via ORAL
  Filled 2015-07-10 (×3): qty 1

## 2015-07-10 NOTE — Progress Notes (Signed)
Pharmacy IV to PO conversion  The patient is receiving Pantoprazole by the intravenous route.  Based on criteria approved by the Pharmacy and Therapeutics Committee and the Medical Executive Committee, the medication is being converted to the equivalent oral dose form.   No active upper GI bleeding or impaired absorption  Not s/p esophagectomy  Documented ability to take oral medications for > 24 hr  Plan to continue treatment for at least 1 day  If you have any questions about this conversion, please contact the Pharmacy Department (ext [phone]).  Thank you.  Bernadene Personrew Carla Herrera, PharmD Pager: [phone] 07/10/2015, 3:08 PM

## 2015-07-10 NOTE — Progress Notes (Signed)
3 Days Post-Op  Subjective: Pain control even better with binder. Had 2 BMs-first one was bloody, second was less so. Not eating or drinking much. Objective: Vital signs in last 24 hours: Temp:  [98.2 F (36.8 C)-99 F (37.2 C)] 98.7 F (37.1 C) (11/13 [id number]) Pulse Rate:  [75-91] 75 (11/13 0638) Resp:  [16-20] 16 (11/13 0638) BP: (121-145)/(63-81) 145/81 mmHg (11/13 0638) SpO2:  [96 %-97 %] 96 % (11/13 0638) Last BM Date: 07/07/15  Intake/Output from previous day: 11/12 0701 - 11/13 0700 In: 2182.9 [P.O.:120; I.V.:2062.9] Out: 2450 [Urine:2450] Intake/Output this shift:    PE: General- In NAD Abdomen-soft, lower midline wound is clean and intact, colostomy site wound open and clean, minimal drain output Lab Results:   Recent Labs  07/08/15 0437  WBC 17.7*  HGB 11.5*  HCT 36.3  PLT 213   BMET  Recent Labs  07/08/15 0437  NA 136  K 4.4  CL 106  CO2 25  GLUCOSE 122*  BUN 11  CREATININE 0.82  CALCIUM 8.2*   PT/INR No results for input(s): LABPROT, INR in the last 72 hours. Comprehensive Metabolic Panel:    Component Value Date/Time   NA 136 07/08/2015 0437   NA 139 06/30/2015 0910   K 4.4 07/08/2015 0437   K 4.1 06/30/2015 0910   CL 106 07/08/2015 0437   CL 105 06/30/2015 0910   CO2 25 07/08/2015 0437   CO2 28 06/30/2015 0910   BUN 11 07/08/2015 0437   BUN 10 06/30/2015 0910   CREATININE 0.82 07/08/2015 0437   CREATININE 0.79 06/30/2015 0910   GLUCOSE 122* 07/08/2015 0437   GLUCOSE 93 06/30/2015 0910   CALCIUM 8.2* 07/08/2015 0437   CALCIUM 9.2 06/30/2015 0910   AST 22 06/30/2015 0910   ALT 20 06/30/2015 0910   ALKPHOS 71 06/30/2015 0910   BILITOT 0.3 06/30/2015 0910   PROT 7.4 06/30/2015 0910   ALBUMIN 4.0 06/30/2015 0910     Studies/Results: No results found.  Anti-infectives: Anti-infectives    Start     Dose/Rate Route Frequency Ordered Stop   07/07/15 1800  cefoTEtan (CEFOTAN) 2 g in dextrose 5 % 50 mL IVPB     2 g 100 mL/hr over  30 Minutes Intravenous Every 12 hours 07/07/15 1444 07/07/15 1816   07/07/15 0512  cefoTEtan in Dextrose 5% (CEFOTAN) IVPB 2 g     2 g Intravenous On call to O.R. 07/07/15 0512 07/07/15 0820      Assessment Active Problems:   Colostomy in place [facility](HCC) s/p laparoscopic assisted closure 07/07/15-bowel function starting to return   LOS: 3 days   Plan: Stay on full liquids.  Oral analgesic.   Carla Herrera J 07/10/2015

## 2015-07-11 MED ORDER — OXYCODONE HCL 5 MG PO TABS: 5.0000 mg | ORAL_TABLET | ORAL | Status: AC | PRN

## 2015-07-11 NOTE — Discharge Instructions (Signed)
Reversal of Colostomy Care After  HOME CARE INSTRUCTIONS  Once home, an ice pack applied to the operative site may help with discomfort and keep swelling down.   Change dressings as directed.   Only take over-the-counter or prescription medicines for pain, discomfort, or fever as directed by your caregiver.   Lowfat diet and normal, light activities as directed.   Do not drive for at least 2-3 weeks and then only if you are no longer taking pain medicine.  There should be no heavy lifting (more than 10 pounds), strenuous activities or contact sports for at least 6-8 weeks, and then only after approval of your surgeon.   Keep the wound dry and clean. The wound may be washed gently with soap and water. Gently blot or dab dry following cleansing, without rubbing. Do not take baths, use swimming pools, or use hot tubs for 3 weeks, or as instructed by your caregivers.    Call the office [phone]) for a followup appointment. SEEK MEDICAL CARE IF (call the office):   There is redness, swelling, or increasing pain in the wound area.   Pus or blood is coming from the wound.   An unexplained oral temperature above 101.5 degrees F develops.   You notice a foul smell coming from the wound or dressing.   There is a breaking open of a wound (edges not staying together) after the sutures have been removed.   There is increasing abdominal pain.   You have persistent vomiting. SEEK IMMEDIATE MEDICAL CARE IF:   A rash develops.   There is difficulty breathing, or development of a reaction or side effects to medications given.

## 2015-07-11 NOTE — Progress Notes (Signed)
Patient called for nurse.  Upon entering room this RN noted that the patient had large volume of blood in floor.  Patient states that she was getting out of the recliner when her JP drain bulb "got stuck" and disconnected.  JP drain reconnected and emptied.  Floor cleaned by housekeeping.  Patient returned to bed.  PRN pain med given.   Patient is stable.

## 2015-07-11 NOTE — Progress Notes (Signed)
4 Days Post-Op  Subjective: Passing a lot of gas but no BM yesterday.  Tolerating full liquid diet.  Comfortable. Objective: Vital signs in last 24 hours: Temp:  [98.9 F (37.2 C)-99.4 F (37.4 C)] 98.9 F (37.2 C) (11/14 0555) Pulse Rate:  [84-91] 84 (11/14 0555) Resp:  [16] 16 (11/14 0555) BP: (97-127)/(49-76) 114/59 mmHg (11/14 0555) SpO2:  [92 %-96 %] 94 % (11/14 0555) Last BM Date: 07/09/15  Intake/Output from previous day: 11/13 0701 - 11/14 0700 In: 2920 [P.O.:120; I.V.:2800] Out: 800 [Urine:800] Intake/Output this shift:    PE: General- In NAD Abdomen-soft, lower midline wound is clean and intact, colostomy site wound open and clean, minimal drain output Lab Results:  No results for input(s): WBC, HGB, HCT, PLT in the last 72 hours. BMET No results for input(s): NA, K, CL, CO2, GLUCOSE, BUN, CREATININE, CALCIUM in the last 72 hours. PT/INR No results for input(s): LABPROT, INR in the last 72 hours. Comprehensive Metabolic Panel:    Component Value Date/Time   NA 136 07/08/2015 0437   NA 139 06/30/2015 0910   K 4.4 07/08/2015 0437   K 4.1 06/30/2015 0910   CL 106 07/08/2015 0437   CL 105 06/30/2015 0910   CO2 25 07/08/2015 0437   CO2 28 06/30/2015 0910   BUN 11 07/08/2015 0437   BUN 10 06/30/2015 0910   CREATININE 0.82 07/08/2015 0437   CREATININE 0.79 06/30/2015 0910   GLUCOSE 122* 07/08/2015 0437   GLUCOSE 93 06/30/2015 0910   CALCIUM 8.2* 07/08/2015 0437   CALCIUM 9.2 06/30/2015 0910   AST 22 06/30/2015 0910   ALT 20 06/30/2015 0910   ALKPHOS 71 06/30/2015 0910   BILITOT 0.3 06/30/2015 0910   PROT 7.4 06/30/2015 0910   ALBUMIN 4.0 06/30/2015 0910     Studies/Results: No results found.  Anti-infectives: Anti-infectives    Start     Dose/Rate Route Frequency Ordered Stop   07/07/15 1800  cefoTEtan (CEFOTAN) 2 g in dextrose 5 % 50 mL IVPB     2 g 100 mL/hr over 30 Minutes Intravenous Every 12 hours 07/07/15 1444 07/07/15 1816   07/07/15 0512   cefoTEtan in Dextrose 5% (CEFOTAN) IVPB 2 g     2 g Intravenous On call to O.R. 07/07/15 0512 07/07/15 0820      Assessment Active Problems:   Colostomy in [facility](HCC) s/p laparoscopic assisted closure 07/07/15-progressing well   LOS: 4 days   Plan: Regular diet.  Heplock IV.  She know how to change her dressing.   Carla Herrera J 07/11/2015

## 2015-07-12 NOTE — Progress Notes (Signed)
5 Days Post-Op  Subjective: Had a soft BM this AM.  Tolerating diet.  Drain bulb got pulled a little then began draining serous fluid.  Would like to go home. Objective: Vital signs in last 24 hours: Temp:  [98.6 F (37 C)-99.3 F (37.4 C)] 99 F (37.2 C) (11/15 0610) Pulse Rate:  [84-89] 89 (11/15 0610) Resp:  [16] 16 (11/15 0610) BP: (100-128)/(49-61) 125/57 mmHg (11/15 0610) SpO2:  [96 %-98 %] 98 % (11/15 0610) Last BM Date: 07/12/15  Intake/Output from previous day: 11/14 0701 - 11/15 0700 In: 1200 [P.O.:1200] Out: 3196 [Urine:3050; Drains:145; Stool:1] Intake/Output this shift:    PE: General- In NAD Abdomen-soft, lower midline wound is clean and intact, colostomy site wound open and clean, serous drain output with no odor. Lab Results:  No results for input(s): WBC, HGB, HCT, PLT in the last 72 hours. BMET No results for input(s): NA, K, CL, CO2, GLUCOSE, BUN, CREATININE, CALCIUM in the last 72 hours. PT/INR No results for input(s): LABPROT, INR in the last 72 hours. Comprehensive Metabolic Panel:    Component Value Date/Time   NA 136 07/08/2015 0437   NA 139 06/30/2015 0910   K 4.4 07/08/2015 0437   K 4.1 06/30/2015 0910   CL 106 07/08/2015 0437   CL 105 06/30/2015 0910   CO2 25 07/08/2015 0437   CO2 28 06/30/2015 0910   BUN 11 07/08/2015 0437   BUN 10 06/30/2015 0910   CREATININE 0.82 07/08/2015 0437   CREATININE 0.79 06/30/2015 0910   GLUCOSE 122* 07/08/2015 0437   GLUCOSE 93 06/30/2015 0910   CALCIUM 8.2* 07/08/2015 0437   CALCIUM 9.2 06/30/2015 0910   AST 22 06/30/2015 0910   ALT 20 06/30/2015 0910   ALKPHOS 71 06/30/2015 0910   BILITOT 0.3 06/30/2015 0910   PROT 7.4 06/30/2015 0910   ALBUMIN 4.0 06/30/2015 0910     Studies/Results: No results found.  Anti-infectives: Anti-infectives    Start     Dose/Rate Route Frequency Ordered Stop   07/07/15 1800  cefoTEtan (CEFOTAN) 2 g in dextrose 5 % 50 mL IVPB     2 g 100 mL/hr over 30 Minutes  Intravenous Every 12 hours 07/07/15 1444 07/07/15 1816   07/07/15 0512  cefoTEtan in Dextrose 5% (CEFOTAN) IVPB 2 g     2 g Intravenous On call to O.R. 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   LOS: 5 days   Plan: Remove drain.  Discharge.  Follow up next week for drain removal.  Instructions given to her.   Calista Crain J 07/12/2015

## 2015-07-12 NOTE — Progress Notes (Signed)
Pt tolerates ambulating around the unit well. Abdominal surgical dressing clean dry and intact. JP intact with minimal serosanguineous  Output. Pt with c/o of tenderness at the surgical site. Prn pain medicine was given per MD. Orders( see  Mar).   Will continue to monitor per Doctor order and unit protocol. Phone and call light in reach.

## 2015-07-12 NOTE — Progress Notes (Signed)
Patient alert and oriented. Ambulating well and tolerating diet. Patient given discharge instructions and details on dressing changes.  Prescription given. Staples removed from left and right lateral abdomen per MD request, and covered with 1/2" steri strips. Patient tolerated well. Incision sites unremarkable with no drainage noted. All questions and concerns answered.

## 2015-07-18 NOTE — Discharge Summary (Signed)
Physician Discharge Summary  Patient ID: Carla Herrera MRN: [id number] DOB/AGE: [birth year] 48 y.o.  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   Discharged Condition: good  Hospital Course: She was admitted 07/07/15 and taken to the OR for the above procedure.  Afterwards, she was put on the colorectal surgery pathway.  The colostomy site skin and subcutaneous tissue was left open to heal by secondary intention via wet to dry dressing changes.  By POD #5, she had return of bowel function, she was ambulating independently, she was tolerating a solid diet, her wounds were healing well, her drain was removed and she was discharged.  Discharge instructions were given to her.  Discharge Exam: Blood pressure 127/64, pulse 87, temperature 98.3 F (36.8 C), temperature source Oral, resp. rate 14, height 5\' 6"  (1.676 m), weight 107.502 kg (237 lb), last menstrual period 06/29/2015, SpO2 95 %.   Disposition: 01-Home or Self Care     Medication List    TAKE these medications        calcium carbonate 1500 (600 CA) MG Tabs tablet  Commonly known as:  OSCAL  Take 600 mg of elemental calcium by mouth daily.     citalopram 40 MG tablet  Commonly known as:  CELEXA  Take 1 tablet (40 mg total) by mouth daily.     ergocalciferol 50000 UNITS capsule  Commonly known as:  VITAMIN D2  Take 50,000 Units by mouth every Sunday.     methocarbamol 750 MG tablet  Commonly known as:  ROBAXIN  Take 1 tablet (750 mg total) by mouth every 8 (eight) hours as needed for muscle spasms.     oxyCODONE 5 MG immediate release tablet  Commonly known as:  Oxy IR/ROXICODONE  Take 1-2 tablets (5-10 mg total) by mouth every 4 (four) hours as needed for moderate pain.     prenatal multivitamin Tabs tablet  Take 1 tablet by mouth daily at 12 noon.          Signed: Adolph PollackOSENBOWER,Jonae Renshaw J 07/18/2015, 11:00 AM

## 2017-07-26 IMAGING — RF DG BE THRU COLOSTOMY
14 of 21 series · 14 of 21 positions shown · non-contrast
Comparison: CT abdomen pelvis of 02/16/2015

CLINICAL DATA: History of diverticulitis with colostomy, for re-
anastomosis

EXAM:
SINGLE COLUMN BARIUM ENEMA
TECHNIQUE: Initial scout AP supine abdominal image obtained to insure adequate
colon cleansing. Barium was introduced into the colon in a
retrograde fashion and refluxed from the rectum to the cecum. Spot
images of the colon followed by overhead radiographs were obtained.
FLUOROSCOPY TIME:  Radiation Exposure Index (as provided by the
fluoroscopic device): 123 Gy per sq cm
If the device does not provide the exposure index:
Fluoroscopy Time:  2 minutes 6 seconds
Number of Acquired Images:

[Series 1: run · 1 of 1 slices shown (1 of 11)]
[im 1/1]
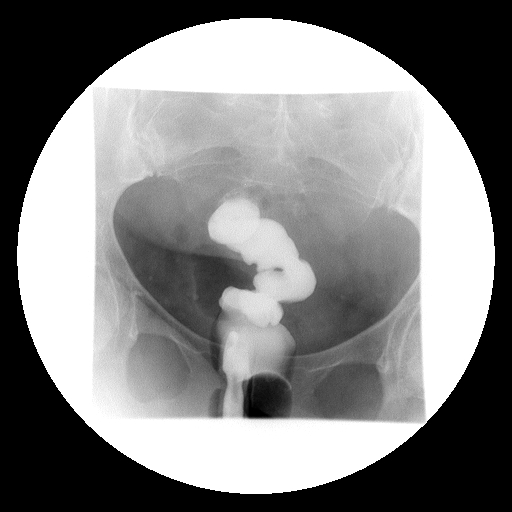

[Series 3: run · 1 of 1 slices shown (2 of 11)]
[im 1/1]
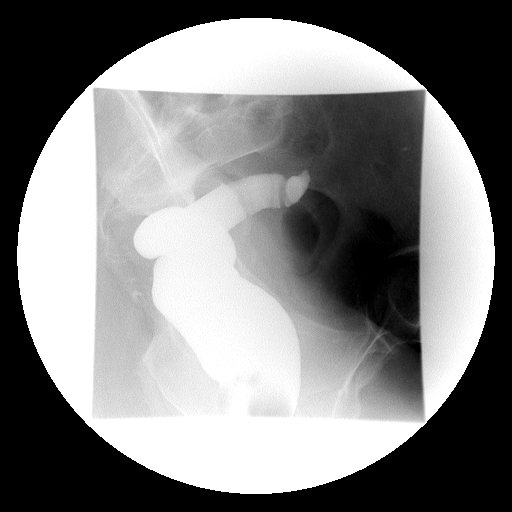

[Series 4: run · 1 of 1 slices shown (3 of 11)]
[im 1/1]
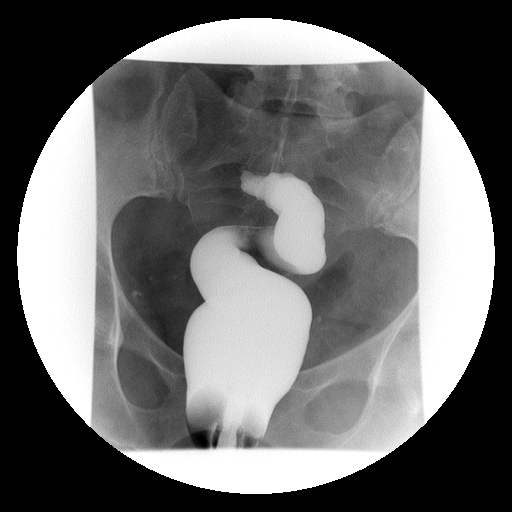

[Series 6: run · 1 of 1 slices shown (4 of 11)]
[im 1/1]
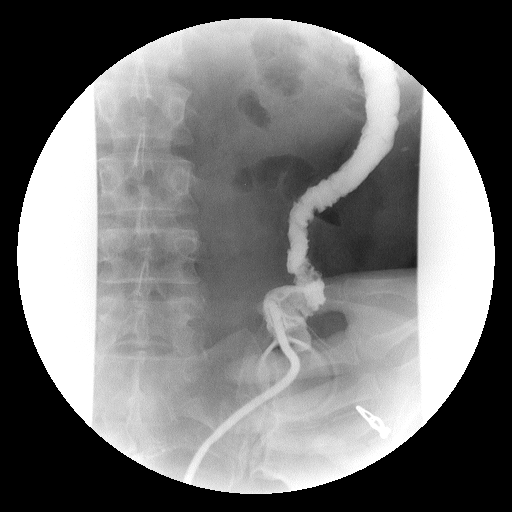

[Series 7: run · 1 of 1 slices shown (5 of 11)]
[im 1/1]
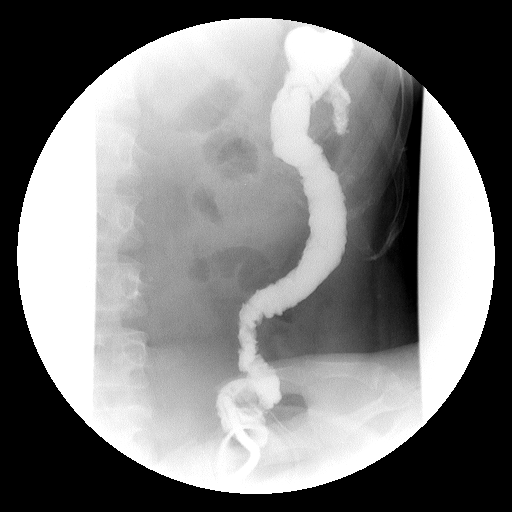

[Series 9: run · 1 of 1 slices shown (6 of 11)]
[im 1/1]
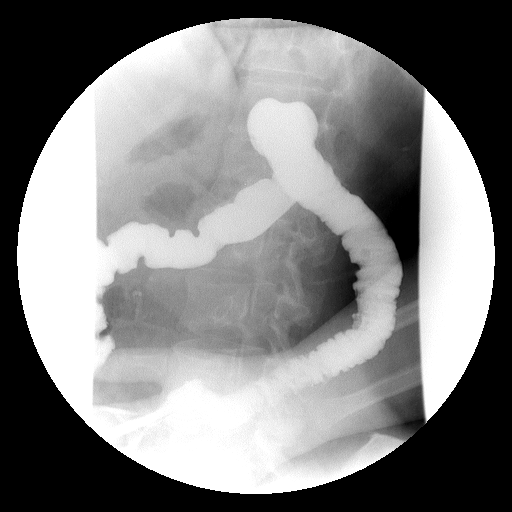

[Series 10: run · 1 of 1 slices shown (7 of 11)]
[im 1/1]
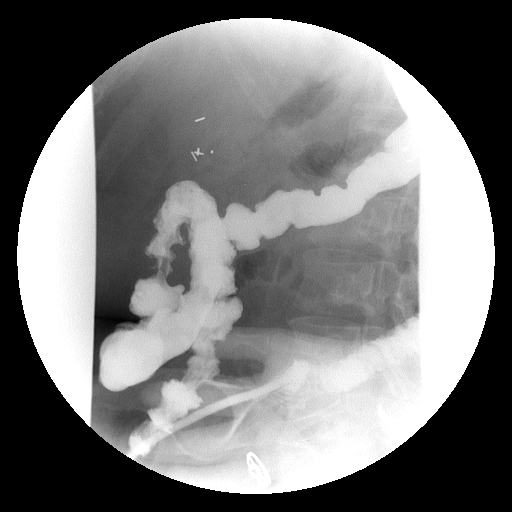

[Series 12: run · 1 of 1 slices shown (8 of 11)]
[im 1/1]
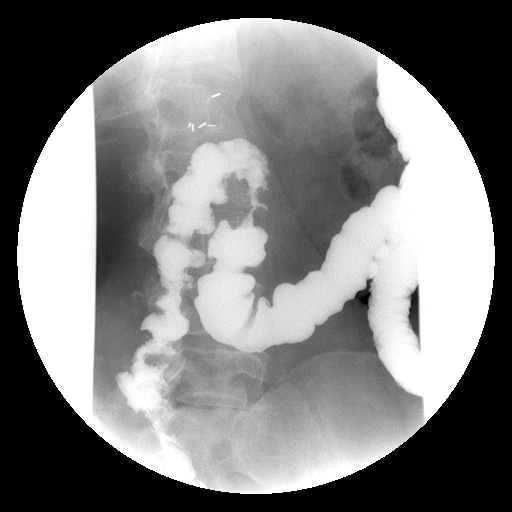

[Series 13: run · 1 of 1 slices shown (9 of 11)]
[im 1/1]
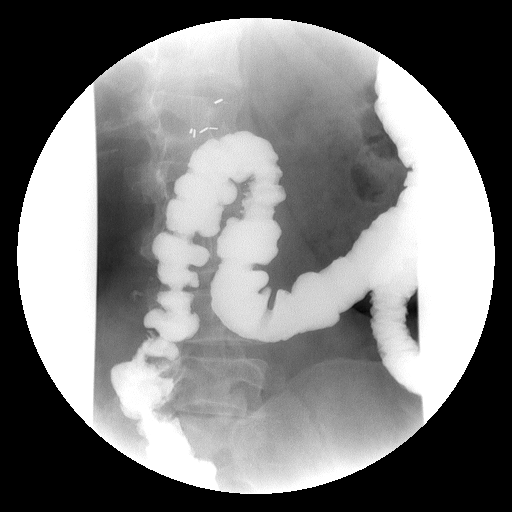

[Series 15: run · 1 of 1 slices shown (10 of 11)]
[im 1/1]
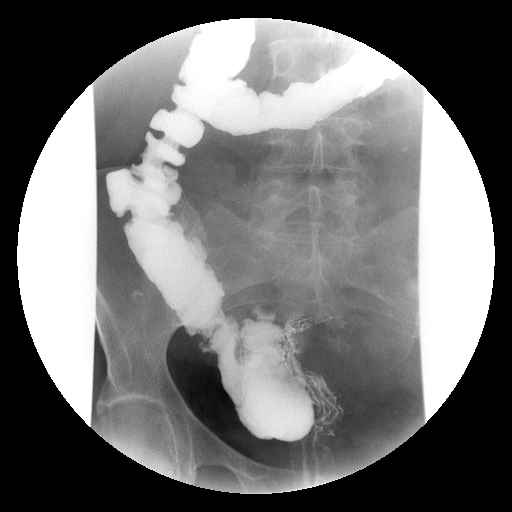

[Series 16: run · 1 of 1 slices shown (11 of 11)]
[im 1/1]
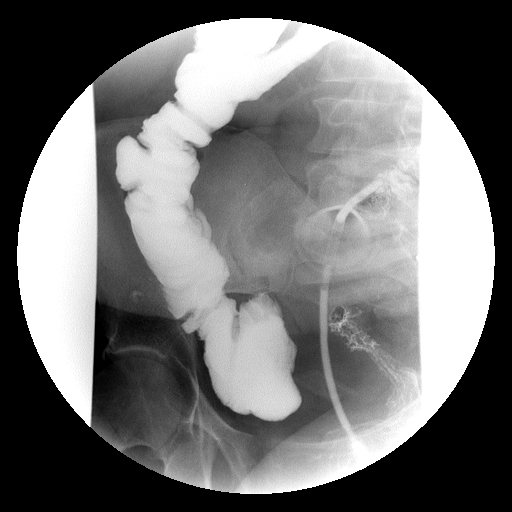

[Series 1002: view not recorded · 0.20mm/px · 1 of 1 slices shown (1 of 3)]
[im 1/1]
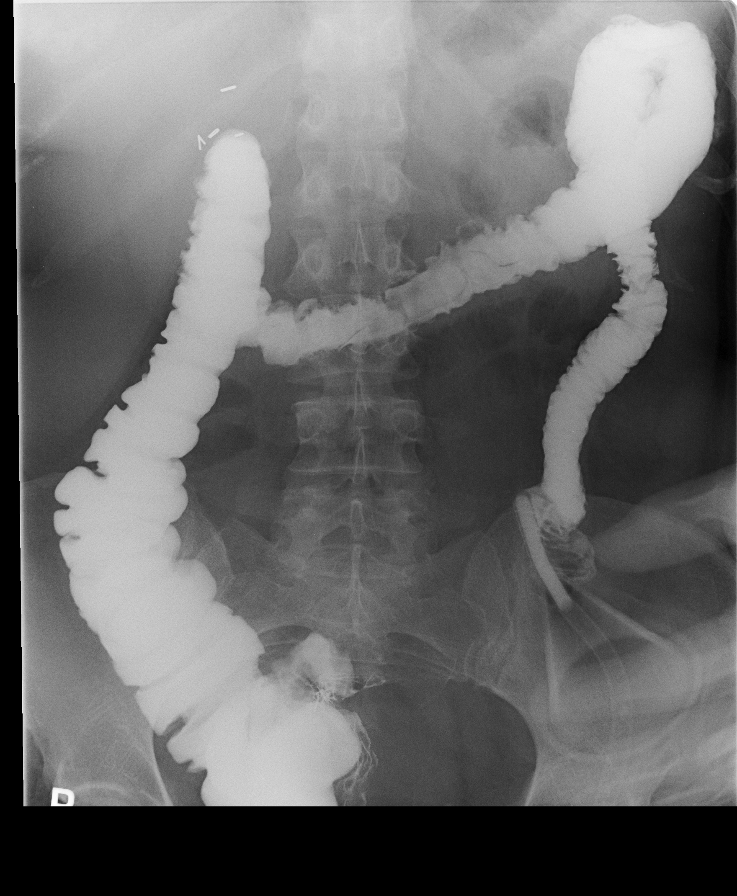

[Series 1003: view not recorded · 0.20mm/px · 1 of 1 slices shown (2 of 3)]
[im 1/1]
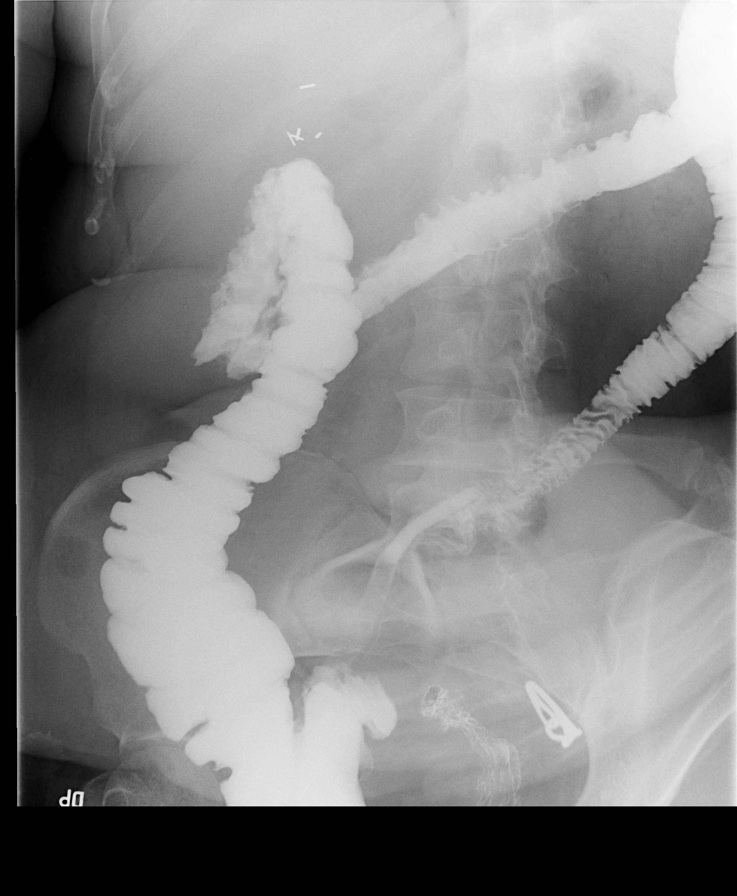

[Series 1005: view not recorded · 0.20mm/px · 1 of 1 slices shown (3 of 3)]
[im 1/1]
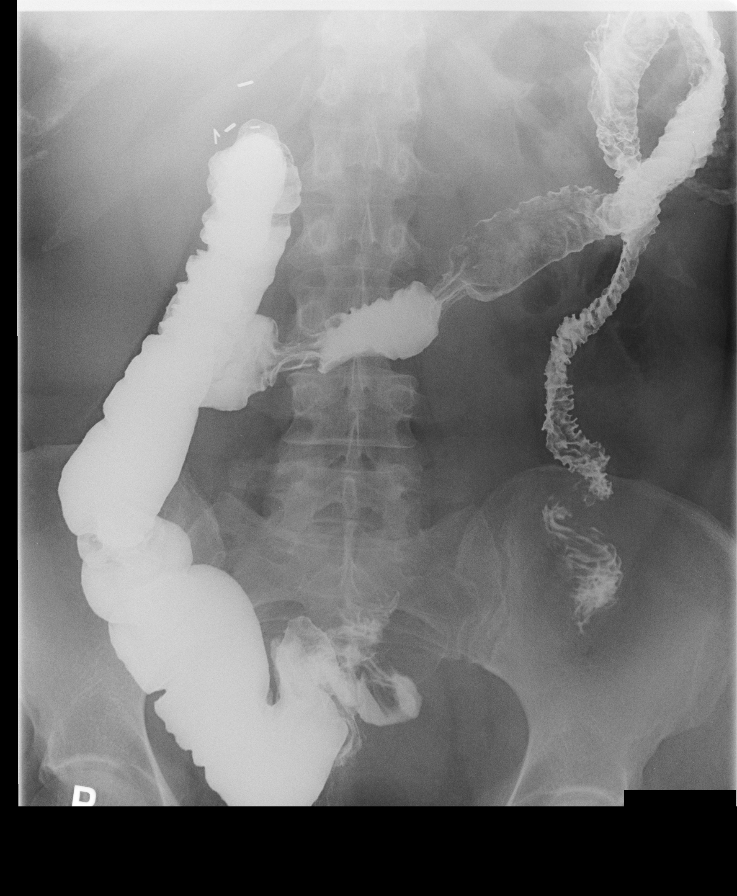

[14 of 21 positions shown; findings below may reference images not displayed]

FINDINGS: A preliminary film of the abdomen shows a nonspecific bowel gas
pattern. The liver edge does extend to the right iliac crest.
Surgical clips are noted in the right upper quadrant from prior
cholecystectomy. An ostomy is present in the left lower quadrant.

Initially barium enema via the rectum was performed. The rectum is
well visualized with no diverticula noted, to point of closure at
site of sigmoid colon resection. No extravasation is seen.

Barium then was infused into the colon via the ostomy. The more
proximal colon is well seen with perhaps a few small diverticula
within the mid distal descending colon. There is some spasm near the
hepatic flexure of colon which does change during the course of the
study. The cecum fills with no abnormality and there is faint reflux
into the terminal ileum which appears unremarkable.
IMPRESSION: 1. The rectal portion of the study is unremarkable. No extravasation
or diverticula are noted.
2. There are only a few tiny mid distal descending colon diverticula
present. No significant abnormality is noted involving the more
proximal colon.

## 2019-05-07 DIAGNOSIS — Z130 Encounter for screening for diseases of the blood and blood-forming organs and certain disorders involving the immune mechanism: Secondary | ICD-10-CM | POA: Diagnosis not present

## 2019-05-07 DIAGNOSIS — Z1329 Encounter for screening for other suspected endocrine disorder: Secondary | ICD-10-CM | POA: Diagnosis not present

## 2019-05-07 DIAGNOSIS — Z13220 Encounter for screening for lipoid disorders: Secondary | ICD-10-CM | POA: Diagnosis not present

## 2019-05-07 DIAGNOSIS — Z0000 Encounter for general adult medical examination without abnormal findings: Secondary | ICD-10-CM | POA: Diagnosis not present

## 2019-05-19 DIAGNOSIS — F3342 Major depressive disorder, recurrent, in full remission: Secondary | ICD-10-CM | POA: Diagnosis not present

## 2019-05-19 DIAGNOSIS — E782 Mixed hyperlipidemia: Secondary | ICD-10-CM | POA: Diagnosis not present

## 2019-05-19 DIAGNOSIS — L0291 Cutaneous abscess, unspecified: Secondary | ICD-10-CM | POA: Diagnosis not present

## 2019-05-19 DIAGNOSIS — E039 Hypothyroidism, unspecified: Secondary | ICD-10-CM | POA: Diagnosis not present

## 2019-06-16 DIAGNOSIS — M79605 Pain in left leg: Secondary | ICD-10-CM | POA: Diagnosis not present

## 2019-06-16 DIAGNOSIS — M7989 Other specified soft tissue disorders: Secondary | ICD-10-CM | POA: Diagnosis not present

## 2019-07-08 DIAGNOSIS — E039 Hypothyroidism, unspecified: Secondary | ICD-10-CM | POA: Diagnosis not present

## 2019-07-14 DIAGNOSIS — E039 Hypothyroidism, unspecified: Secondary | ICD-10-CM | POA: Diagnosis not present

## 2019-07-14 DIAGNOSIS — R5383 Other fatigue: Secondary | ICD-10-CM | POA: Diagnosis not present

## 2019-07-28 DIAGNOSIS — Z1231 Encounter for screening mammogram for malignant neoplasm of breast: Secondary | ICD-10-CM | POA: Diagnosis not present

## 2019-08-13 DIAGNOSIS — J029 Acute pharyngitis, unspecified: Secondary | ICD-10-CM | POA: Diagnosis not present

## 2019-08-13 DIAGNOSIS — Z20828 Contact with and (suspected) exposure to other viral communicable diseases: Secondary | ICD-10-CM | POA: Diagnosis not present

## 2019-08-31 DIAGNOSIS — Z1329 Encounter for screening for other suspected endocrine disorder: Secondary | ICD-10-CM | POA: Diagnosis not present

## 2019-08-31 DIAGNOSIS — E039 Hypothyroidism, unspecified: Secondary | ICD-10-CM | POA: Diagnosis not present

## 2019-09-07 DIAGNOSIS — E039 Hypothyroidism, unspecified: Secondary | ICD-10-CM | POA: Diagnosis not present

## 2019-09-07 DIAGNOSIS — F3342 Major depressive disorder, recurrent, in full remission: Secondary | ICD-10-CM | POA: Diagnosis not present

## 2019-09-07 DIAGNOSIS — M546 Pain in thoracic spine: Secondary | ICD-10-CM | POA: Diagnosis not present

## 2019-09-07 DIAGNOSIS — M47814 Spondylosis without myelopathy or radiculopathy, thoracic region: Secondary | ICD-10-CM | POA: Diagnosis not present

## 2019-09-07 DIAGNOSIS — M47812 Spondylosis without myelopathy or radiculopathy, cervical region: Secondary | ICD-10-CM | POA: Diagnosis not present

## 2019-09-07 DIAGNOSIS — M542 Cervicalgia: Secondary | ICD-10-CM | POA: Diagnosis not present

## 2019-09-07 DIAGNOSIS — E559 Vitamin D deficiency, unspecified: Secondary | ICD-10-CM | POA: Diagnosis not present

## 2019-10-22 DIAGNOSIS — N921 Excessive and frequent menstruation with irregular cycle: Secondary | ICD-10-CM | POA: Diagnosis not present

## 2020-01-02 DIAGNOSIS — Z0000 Encounter for general adult medical examination without abnormal findings: Secondary | ICD-10-CM | POA: Diagnosis not present

## 2020-01-05 DIAGNOSIS — R739 Hyperglycemia, unspecified: Secondary | ICD-10-CM | POA: Diagnosis not present

## 2020-01-05 DIAGNOSIS — Z23 Encounter for immunization: Secondary | ICD-10-CM | POA: Diagnosis not present

## 2020-01-05 DIAGNOSIS — G43009 Migraine without aura, not intractable, without status migrainosus: Secondary | ICD-10-CM | POA: Diagnosis not present

## 2020-01-05 DIAGNOSIS — E559 Vitamin D deficiency, unspecified: Secondary | ICD-10-CM | POA: Diagnosis not present

## 2020-01-05 DIAGNOSIS — Z0000 Encounter for general adult medical examination without abnormal findings: Secondary | ICD-10-CM | POA: Diagnosis not present

## 2020-05-16 DIAGNOSIS — R519 Headache, unspecified: Secondary | ICD-10-CM | POA: Diagnosis not present

## 2020-05-16 DIAGNOSIS — E038 Other specified hypothyroidism: Secondary | ICD-10-CM | POA: Diagnosis not present

## 2020-05-16 DIAGNOSIS — N3281 Overactive bladder: Secondary | ICD-10-CM | POA: Diagnosis not present

## 2020-05-16 DIAGNOSIS — F419 Anxiety disorder, unspecified: Secondary | ICD-10-CM | POA: Diagnosis not present

## 2020-06-22 DIAGNOSIS — H10233 Serous conjunctivitis, except viral, bilateral: Secondary | ICD-10-CM | POA: Diagnosis not present

## 2020-09-28 DIAGNOSIS — E039 Hypothyroidism, unspecified: Secondary | ICD-10-CM | POA: Diagnosis not present

## 2020-12-02 DIAGNOSIS — J019 Acute sinusitis, unspecified: Secondary | ICD-10-CM | POA: Diagnosis not present

## 2020-12-02 DIAGNOSIS — J209 Acute bronchitis, unspecified: Secondary | ICD-10-CM | POA: Diagnosis not present

## 2021-01-30 DIAGNOSIS — H00012 Hordeolum externum right lower eyelid: Secondary | ICD-10-CM | POA: Diagnosis not present

## 2021-04-27 DIAGNOSIS — R309 Painful micturition, unspecified: Secondary | ICD-10-CM | POA: Diagnosis not present

## 2021-05-08 DIAGNOSIS — Z113 Encounter for screening for infections with a predominantly sexual mode of transmission: Secondary | ICD-10-CM | POA: Diagnosis not present

## 2021-05-08 DIAGNOSIS — Z6841 Body Mass Index (BMI) 40.0 and over, adult: Secondary | ICD-10-CM | POA: Diagnosis not present

## 2021-05-08 DIAGNOSIS — Z01419 Encounter for gynecological examination (general) (routine) without abnormal findings: Secondary | ICD-10-CM | POA: Diagnosis not present

## 2021-05-08 DIAGNOSIS — Z124 Encounter for screening for malignant neoplasm of cervix: Secondary | ICD-10-CM | POA: Diagnosis not present

## 2021-05-10 DIAGNOSIS — F419 Anxiety disorder, unspecified: Secondary | ICD-10-CM | POA: Diagnosis not present

## 2021-05-10 DIAGNOSIS — E038 Other specified hypothyroidism: Secondary | ICD-10-CM | POA: Diagnosis not present

## 2021-05-10 DIAGNOSIS — N3281 Overactive bladder: Secondary | ICD-10-CM | POA: Diagnosis not present

## 2021-05-10 DIAGNOSIS — R519 Headache, unspecified: Secondary | ICD-10-CM | POA: Diagnosis not present

## 2021-05-18 DIAGNOSIS — Z130 Encounter for screening for diseases of the blood and blood-forming organs and certain disorders involving the immune mechanism: Secondary | ICD-10-CM | POA: Diagnosis not present

## 2021-05-18 DIAGNOSIS — Z131 Encounter for screening for diabetes mellitus: Secondary | ICD-10-CM | POA: Diagnosis not present

## 2021-05-18 DIAGNOSIS — Z13220 Encounter for screening for lipoid disorders: Secondary | ICD-10-CM | POA: Diagnosis not present

## 2021-05-18 DIAGNOSIS — Z0000 Encounter for general adult medical examination without abnormal findings: Secondary | ICD-10-CM | POA: Diagnosis not present

## 2021-05-18 DIAGNOSIS — E039 Hypothyroidism, unspecified: Secondary | ICD-10-CM | POA: Diagnosis not present

## 2021-11-10 ENCOUNTER — Other Ambulatory Visit: Payer: Self-pay | Admitting: Family Medicine

## 2021-11-10 DIAGNOSIS — E049 Nontoxic goiter, unspecified: Secondary | ICD-10-CM

## 2021-11-16 ENCOUNTER — Other Ambulatory Visit: Payer: 59

## 2021-11-28 ENCOUNTER — Ambulatory Visit
Admission: RE | Admit: 2021-11-28 | Discharge: 2021-11-28 | Disposition: A | Discharge status: Home / Self Care | Payer: 59 | Source: Ambulatory Visit | Attending: Family Medicine | Admitting: Family Medicine

## 2021-11-28 DIAGNOSIS — E049 Nontoxic goiter, unspecified: Secondary | ICD-10-CM

## 2024-02-12 IMAGING — US US THYROID
1 series · 13 of 25 positions shown · non-contrast
Comparison: None.

CLINICAL DATA: 55-year-old female with a history of thyroid
enlargement

EXAM:
THYROID ULTRASOUND
TECHNIQUE: Ultrasound examination of the thyroid gland and adjacent soft
tissues was performed.

[Series 1: us thyroid · 0.08mm/px · 39 acquisitions, 13 frames shown]
[im 1/39]
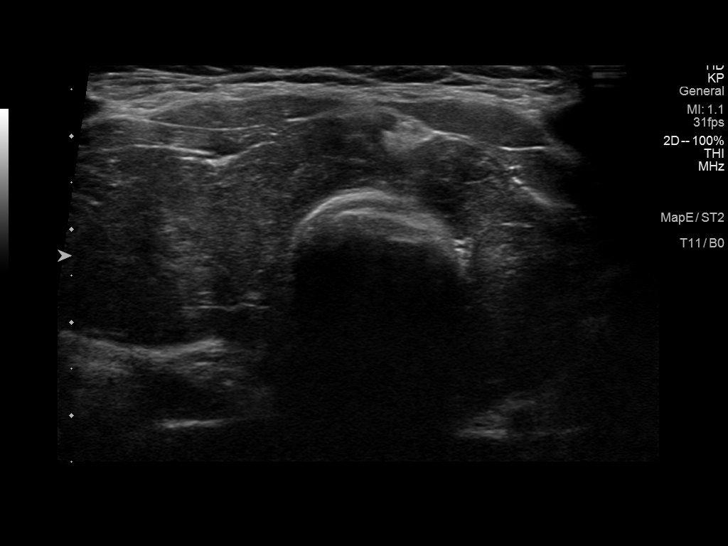
[im 4/39]
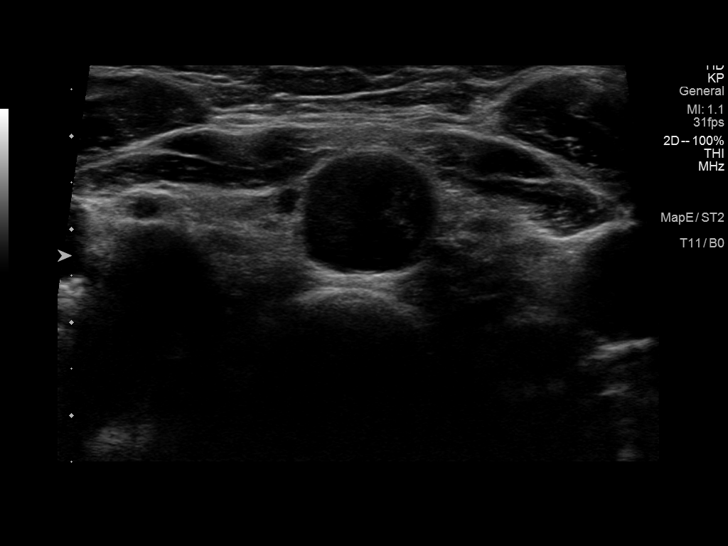
[im 7/39]
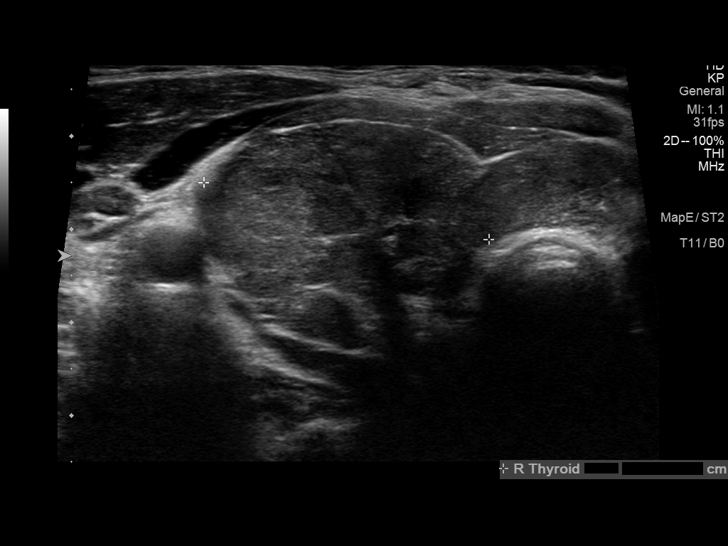
[im 10/39]
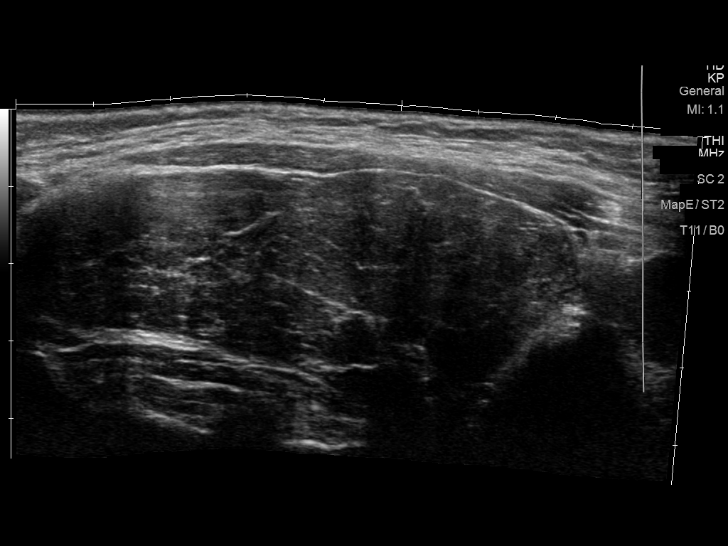
[im 13/39]
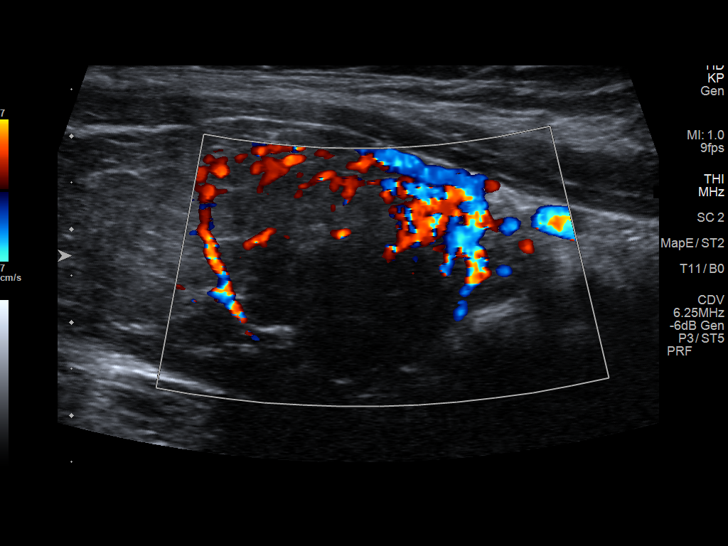
[im 16/39]
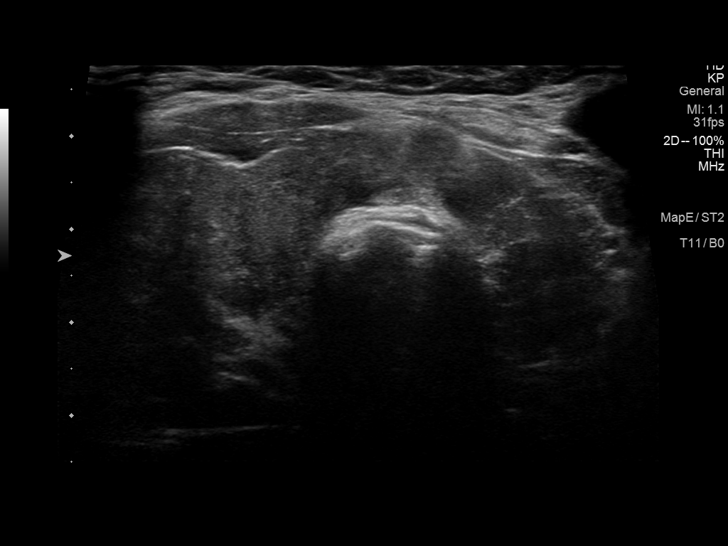
[im 20/39]
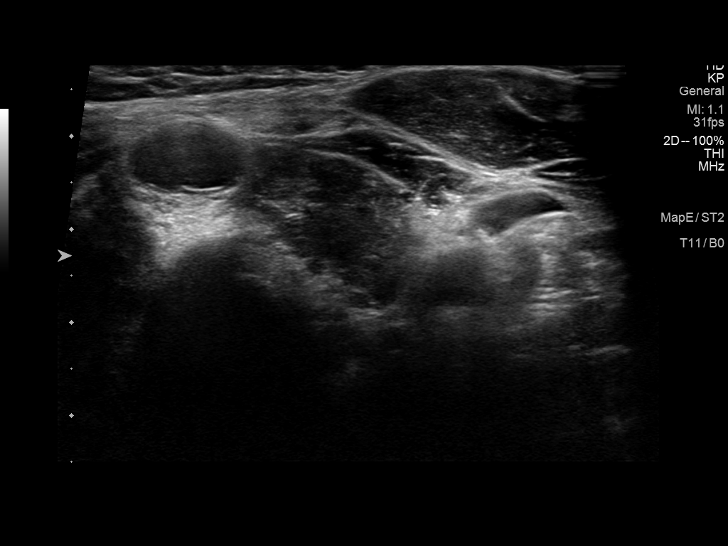
[im 23/39]
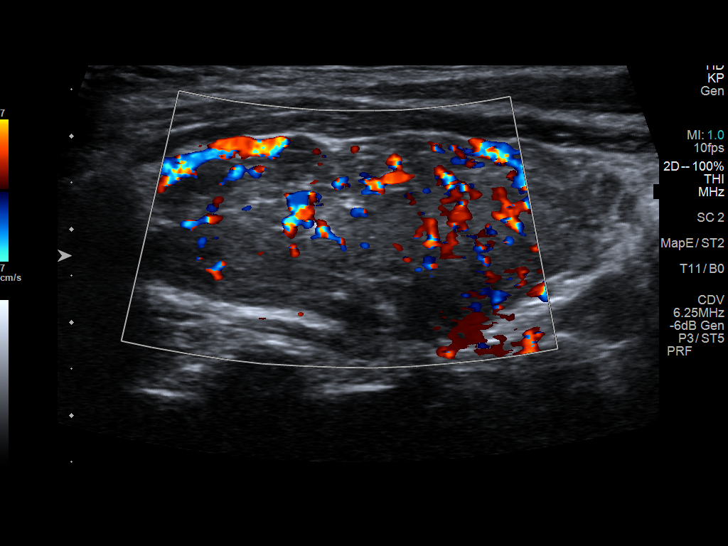
[im 26/39]
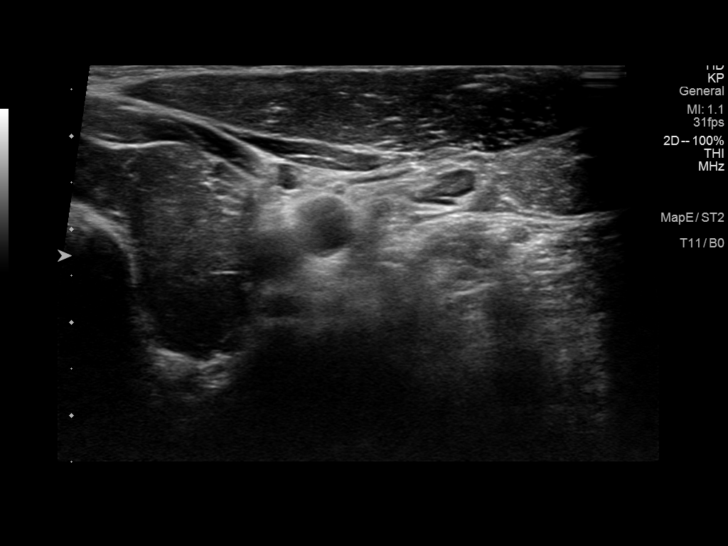
[im 29/39]
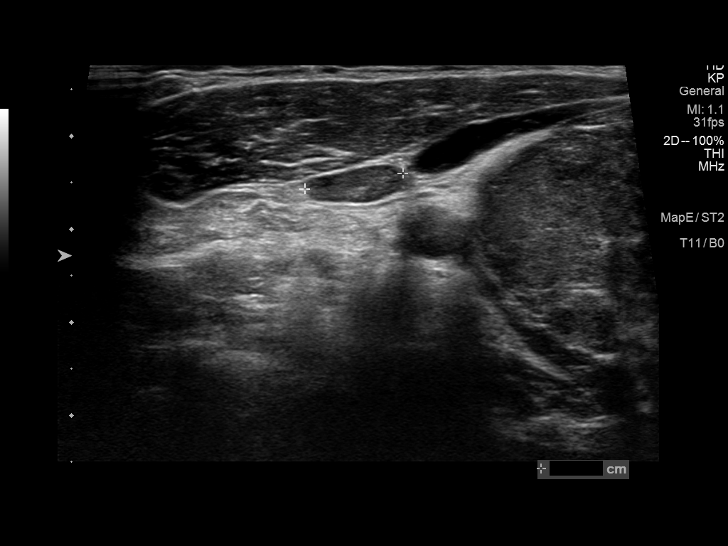
[im 32/39]
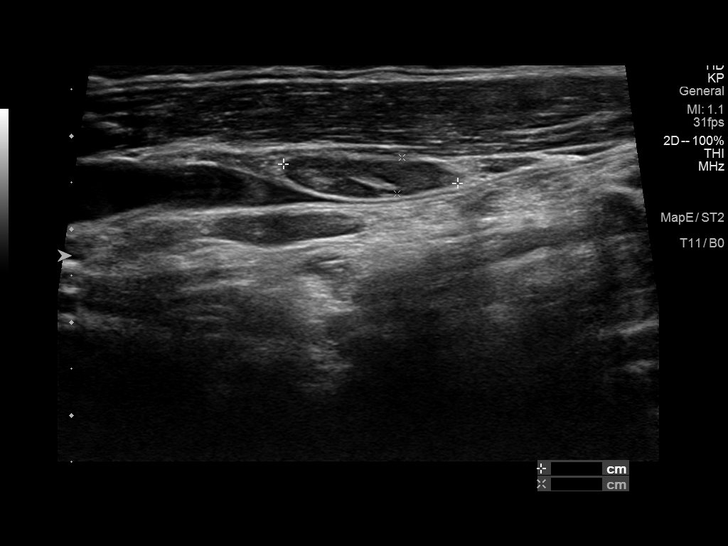
[im 35/39]
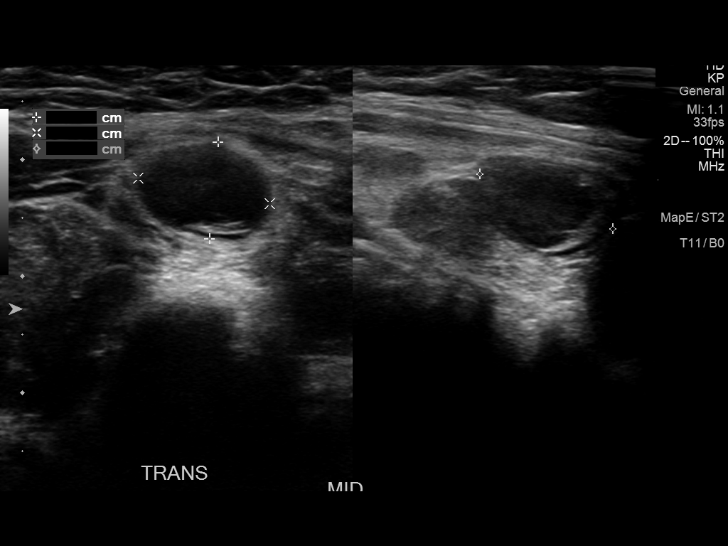
[im 39/39]
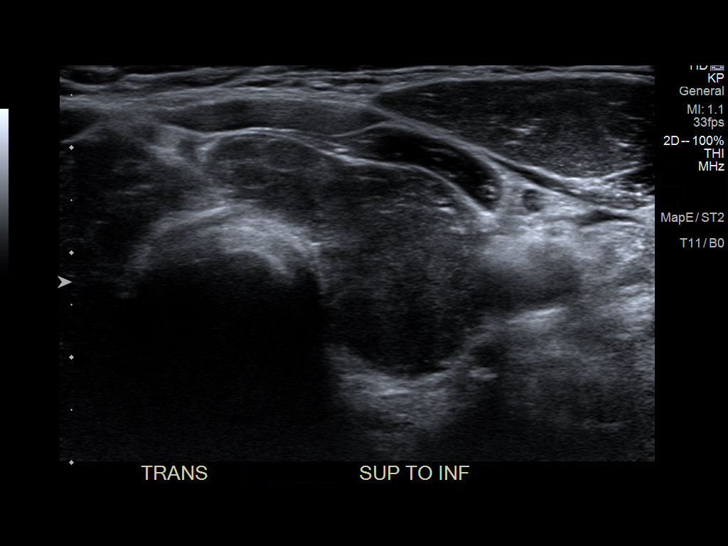

[13 of 25 positions shown; findings below may reference images not displayed]

FINDINGS: Parenchymal Echotexture: Markedly heterogenous

Isthmus: 0.8 cm

Right lobe: 7.1 cm x 2.6 cm x 3.1 cm

Left lobe: 5.4 cm x 1.8 cm x 1.4 cm

_________________________________________________________

Estimated total number of nodules >/= 1 cm: 2

Number of spongiform nodules >/=  2 cm not described below (TR1): 0

Number of mixed cystic and solid nodules >/= 1.5 cm not described
below (TR2): 0

_________________________________________________________

Nodule # 1:

Location: Isthmus; superior

Maximum size: 1.2 cm; Other 2 dimensions: 1.2 cm x 10.8 cm

Composition: cystic/almost completely cystic (0)

Echogenicity: anechoic (0)

Shape: not taller-than-wide (0)

Margins: smooth (0)

Echogenic foci: none (0)

ACR TI-RADS total points: 0.

ACR TI-RADS risk category: TR1 (0-1 points).

ACR TI-RADS recommendations:

Cystic nodule does not meet criteria for surveillance or biopsy

_________________________________________________________

Nodule # 2:

Location: Isthmus; inferior

Maximum size: 1.5 cm; Other 2 dimensions: 1.4 cm x 1.4 cm

Composition: cystic/almost completely cystic (0)

Echogenicity: anechoic (0)

Shape: not taller-than-wide (0)

Margins: smooth (0)

Echogenic foci: none (0)

ACR TI-RADS total points: 0.

ACR TI-RADS risk category: TR1 (0-1 points).

ACR TI-RADS recommendations:

Cystic nodule does not meet criteria for surveillance or biopsy

_________________________________________________________

Typical appearing lymph nodes in the cervical nodal drain, not
enlarged.
IMPRESSION: Markedly heterogeneous thyroid tissue, indicating medical thyroid
disease.

No thyroid nodule meets criteria for biopsy or surveillance, as
designated by the newly established ACR TI-RADS criteria.

Typical appearing lymph nodes within the cervical nodal chain,
nonspecific though most likely reactive.

Recommendations follow those established by the new ACR TI-RADS
criteria ([HOSPITAL] 0842;[DATE]).

## 2024-07-03 ENCOUNTER — Other Ambulatory Visit: Payer: Self-pay | Admitting: Obstetrics & Gynecology

## 2024-07-03 DIAGNOSIS — E079 Disorder of thyroid, unspecified: Secondary | ICD-10-CM

## 2024-07-20 ENCOUNTER — Ambulatory Visit
Admission: RE | Admit: 2024-07-20 | Discharge: 2024-07-20 | Disposition: A | Discharge status: Home / Self Care | Source: Ambulatory Visit | Attending: Obstetrics & Gynecology | Admitting: Obstetrics & Gynecology

## 2024-07-20 DIAGNOSIS — E079 Disorder of thyroid, unspecified: Secondary | ICD-10-CM
